# Patient Record
Sex: Female | Born: 1985 | Race: Black or African American | Hispanic: No | Marital: Single | Smoking: Never smoker
Health system: Southern US, Community
[De-identification: ages and names within clinical notes are randomized; demographics above are authoritative.]

## PROBLEM LIST (undated history)

## (undated) ENCOUNTER — Inpatient Hospital Stay (HOSPITAL_COMMUNITY): Payer: Self-pay

## (undated) DIAGNOSIS — O24419 Gestational diabetes mellitus in pregnancy, unspecified control: Secondary | ICD-10-CM

## (undated) DIAGNOSIS — R87619 Unspecified abnormal cytological findings in specimens from cervix uteri: Secondary | ICD-10-CM

## (undated) DIAGNOSIS — Z9889 Other specified postprocedural states: Secondary | ICD-10-CM

## (undated) DIAGNOSIS — Z789 Other specified health status: Secondary | ICD-10-CM

## (undated) HISTORY — PX: HERNIA REPAIR: SHX51

## (undated) HISTORY — DX: Gestational diabetes mellitus in pregnancy, unspecified control: O24.419

## (undated) HISTORY — PX: WISDOM TOOTH EXTRACTION: SHX21

## (undated) HISTORY — PX: COLPOSCOPY: SHX161

---
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
## 2007-10-01 ENCOUNTER — Other Ambulatory Visit: Admission: RE | Admit: 2007-10-01 | Discharge: 2007-10-01 | Payer: Self-pay | Admitting: Gynecology

## 2008-10-02 ENCOUNTER — Other Ambulatory Visit: Admission: RE | Admit: 2008-10-02 | Discharge: 2008-10-02 | Payer: Self-pay | Admitting: Gynecology

## 2008-10-02 ENCOUNTER — Encounter: Payer: Self-pay | Admitting: Gynecology

## 2008-10-02 ENCOUNTER — Ambulatory Visit: Payer: Self-pay | Admitting: Gynecology

## 2008-10-21 ENCOUNTER — Ambulatory Visit: Payer: Self-pay | Admitting: Gynecology

## 2009-10-04 ENCOUNTER — Ambulatory Visit: Payer: Self-pay | Admitting: Gynecology

## 2009-10-04 ENCOUNTER — Other Ambulatory Visit: Admission: RE | Admit: 2009-10-04 | Discharge: 2009-10-04 | Payer: Self-pay | Admitting: Gynecology

## 2009-11-04 ENCOUNTER — Ambulatory Visit: Payer: Self-pay | Admitting: Gynecology

## 2010-10-11 ENCOUNTER — Other Ambulatory Visit (HOSPITAL_COMMUNITY)
Admission: RE | Admit: 2010-10-11 | Discharge: 2010-10-11 | Disposition: A | Discharge status: Home / Self Care | Payer: 59 | Source: Ambulatory Visit | Attending: Gynecology | Admitting: Gynecology

## 2010-10-11 ENCOUNTER — Encounter: Payer: 59 | Admitting: Gynecology

## 2010-10-11 ENCOUNTER — Other Ambulatory Visit: Payer: Self-pay | Admitting: Gynecology

## 2010-10-11 DIAGNOSIS — Z113 Encounter for screening for infections with a predominantly sexual mode of transmission: Secondary | ICD-10-CM

## 2010-10-11 DIAGNOSIS — Z01419 Encounter for gynecological examination (general) (routine) without abnormal findings: Secondary | ICD-10-CM

## 2010-10-11 DIAGNOSIS — Z833 Family history of diabetes mellitus: Secondary | ICD-10-CM

## 2010-10-11 DIAGNOSIS — R82998 Other abnormal findings in urine: Secondary | ICD-10-CM

## 2010-10-11 DIAGNOSIS — Z124 Encounter for screening for malignant neoplasm of cervix: Secondary | ICD-10-CM | POA: Insufficient documentation

## 2011-01-20 NOTE — Op Note (Signed)
[facility]  Patient:    Toni Morgan, Toni Morgan                        MRN: [id number] Proc. Date: 12/21/99 Adm. Date:  [id number] Attending:  Fayette Pho Damodar CC:         Merita Norton, M.D.                           Operative Report  PREOPERATIVE DIAGNOSIS:  Umbilical hernia.  POSTOPERATIVE DIAGNOSIS:  Umbilical hernia.  OPERATION PERFORMED:  Repair of umbilical hernia.  SURGEON:  Prabhakar D. Levie Heritage, M.D.  ASSISTANT:  Nurse.  ANESTHESIA:  Nurse.  DESCRIPTION OF PROCEDURE:  Under satisfactory general anesthesia with the patient in the supine position, the abdomen was thoroughly prepped and draped in the usual manner.  A curvilinear infraumbilical incision was made.  The skin and subcutaneous tissue was incised.  Bleeders were individually clamped, cut and electrocoagulated. Blunt and sharp dissection was carried out to isolate the umbilical hernia sac.  The neck of the sac was opened.  Bleeders were clamped, cut and electrocoagulated. The umbilical fascia defect was repaired in two layers, the first layer of #32 ire vertical mattress sutures, the second layer of 3-0 Vicryl interrupted suture. Satisfactory repair was accomplished.  Excess umbilical hernia sac was excised nd hemostasis accomplished.  Then, 0.25% Marcaine with epinephrine was injected locally for postoperative analgesia.  The subcutaneous tissue was approximated ith 4-0 Vicryl.  The skin was closed with 4-0 Monocryl subcuticular sutures.  There was minimal gaping of the incision.  Hence, 5-0 chromic interrupted sutures were used. Appropriate pressure dressing was applied.  Throughout the procedure, the patients vital signs remained stable.  The patient withstood the procedure well and was transferred to the recovery room in satisfactory general condition. DD:  12/21/99 TD:  12/21/99 Job: [id number]

## 2011-07-13 ENCOUNTER — Emergency Department (HOSPITAL_BASED_OUTPATIENT_CLINIC_OR_DEPARTMENT_OTHER)
Admission: EM | Admit: 2011-07-13 | Discharge: 2011-07-13 | Disposition: A | Discharge status: Home / Self Care | Payer: Self-pay | Attending: Emergency Medicine | Admitting: Emergency Medicine

## 2011-07-13 ENCOUNTER — Encounter: Payer: Self-pay | Admitting: Family Medicine

## 2011-07-13 DIAGNOSIS — L600 Ingrowing nail: Secondary | ICD-10-CM | POA: Insufficient documentation

## 2011-07-13 DIAGNOSIS — M79609 Pain in unspecified limb: Secondary | ICD-10-CM | POA: Insufficient documentation

## 2011-07-13 NOTE — ED Notes (Signed)
Pt sts "I think I have an ingrown toe nail on my left great toe". Pt reports soaking toe at home.

## 2011-07-13 NOTE — ED Provider Notes (Signed)
History     CSN: [id number] Arrival date & time: 07/13/2011  5:21 PM   First MD Initiated Contact with Patient 07/13/11 1758      Chief Complaint  Patient presents with  . Toe Pain    (Consider location/radiation/quality/duration/timing/severity/associated sxs/prior treatment) HPI The patient presents with 1 week of pain in her left great toe. She notes that prior to the onset of this pain she was in her usual state of health. Since onset she has had persistent pain focally about the left great toe on the lateral aspect of the nailbed. The pain has been worse with ambulation, better as she has manipulated the toe and had soakings. She notes that she is been able to decrease the pain as she has produced more fluid and pus and blood from the edge of the nailbed. She also does size has decreased, but presents for reassurance and further guidance. She denies any fevers, chills, nausea, vomiting, diarrhea or any other focal complaints. History reviewed. No pertinent past medical history.  Past Surgical History  Procedure Date  . Hernia repair     No family history on file.  History  Substance Use Topics  . Smoking status: Never Smoker   . Smokeless tobacco: Not on file  . Alcohol Use: No    OB History    Grav Para Term Preterm Abortions TAB SAB Ect Mult Living                  Review of Systems  All other systems reviewed and are negative.    Allergies  Review of patient's allergies indicates no known allergies.  Home Medications  No current outpatient prescriptions on file.  BP 123/75  Pulse 96  Temp(Src) 98.1 F (36.7 C) (Oral)  Resp 16  Ht 5\' 8"  (1.727 m)  Wt 185 lb (83.915 kg)  BMI 28.13 kg/m2  SpO2 100%  LMP 06/15/2011  Physical Exam  Constitutional: She appears well-developed and well-nourished. No distress.  HENT:  Head: Normocephalic and atraumatic.  Eyes: Conjunctivae and EOM are normal.  Cardiovascular: Intact distal pulses.   Musculoskeletal:         The left great is within normal limits. The left great toe on the lateral edge of the nailbed has evidence of an ingrown toenail, with no erythema no mild pressure there is a small amount of serous and pus like fluid extruding from the edge of the nailbed. All neurovascular functions are intact.  Neurological: She is alert. No cranial nerve deficit.  Skin: Skin is warm and dry.  Psychiatric: She has a normal mood and affect.    ED Course  Procedures (including critical care time)  Labs Reviewed - No data to display No results found.   No diagnosis found.    MDM  This 25 year old female presents with left great toe pain. On exam she is in no distress, has findings consistent with an ingrown toe and local infection. The absence of any erythema, fevers, chills, remarkable pain is all reassuring for a local process. The pros and cons of antibiotics were discussed with the patient. She'll not be placed on antibiotics. She was given explicit care instructions, and return precautions.       Gerhard Munch, MD 07/13/11 [phone]

## 2012-07-23 ENCOUNTER — Encounter (HOSPITAL_COMMUNITY): Payer: Self-pay | Admitting: *Deleted

## 2012-07-23 ENCOUNTER — Emergency Department (INDEPENDENT_AMBULATORY_CARE_PROVIDER_SITE_OTHER)
Admission: EM | Admit: 2012-07-23 | Discharge: 2012-07-23 | Disposition: A | Discharge status: Home / Self Care | Payer: Self-pay | Source: Home / Self Care | Attending: Family Medicine | Admitting: Family Medicine

## 2012-07-23 DIAGNOSIS — J029 Acute pharyngitis, unspecified: Secondary | ICD-10-CM

## 2012-07-23 LAB — POCT RAPID STREP A: Streptococcus, Group A Screen (Direct): NEGATIVE

## 2012-07-23 MED ORDER — AMOXICILLIN-POT CLAVULANATE 875-125 MG PO TABS: 1.0000 | ORAL_TABLET | Freq: Two times a day (BID) | ORAL | Status: DC

## 2012-07-23 NOTE — ED Notes (Signed)
Pt    Has  Symptoms    Of      sorethroat  Redness   And pain   When  She  Swallows        She has      Redness   And    Exudate  In tonsils       Symptoms  X     2  Days    She  Is  Sitting  Upright opn  Exam table  Speaking in  Complete  sentances

## 2012-07-23 NOTE — ED Provider Notes (Signed)
History     CSN: [id number]  Arrival date & time 07/23/12  4098   First MD Initiated Contact with Patient 07/23/12 [phone]      Chief Complaint  Patient presents with  . Sore Throat    (Consider location/radiation/quality/duration/timing/severity/associated sxs/prior treatment) Patient is a 26 y.o. female presenting with pharyngitis. The history is provided by the patient.  Sore Throat This is a recurrent (3 episode since aug, no medical rx however, just uses lozenges.) problem. The current episode started 2 days ago. The problem has been gradually worsening. Associated symptoms comments: No sick contacts.. The symptoms are aggravated by swallowing.    History reviewed. No pertinent past medical history.  Past Surgical History  Procedure Date  . Hernia repair     No family history on file.  History  Substance Use Topics  . Smoking status: Never Smoker   . Smokeless tobacco: Not on file  . Alcohol Use: No    OB History    Grav Para Term Preterm Abortions TAB SAB Ect Mult Living                  Review of Systems  Constitutional: Positive for fever, chills and appetite change.  HENT: Positive for sore throat and neck pain. Negative for neck stiffness.   Skin: Negative for rash.    Allergies  Review of patient's allergies indicates no known allergies.  Home Medications   Current Outpatient Rx  Name  Route  Sig  Dispense  Refill  . AMOXICILLIN-POT CLAVULANATE 875-125 MG PO TABS   Oral   Take 1 tablet by mouth 2 (two) times daily.   20 tablet   0     BP 137/82  Pulse 100  Temp 98.8 F (37.1 C) (Oral)  Resp 20  SpO2 96%  LMP 06/30/2012  Physical Exam  Nursing note and vitals reviewed. Constitutional: She appears well-developed and well-nourished.  HENT:  Head: Normocephalic.  Right Ear: External ear normal.  Left Ear: External ear normal.  Mouth/Throat: Uvula is midline and mucous membranes are normal. Oropharyngeal exudate and posterior  oropharyngeal erythema present.  Eyes: Conjunctivae normal are normal. Pupils are equal, round, and reactive to light.  Neck: Normal range of motion. Neck supple.  Cardiovascular: Normal rate, normal heart sounds and intact distal pulses.   Pulmonary/Chest: Effort normal and breath sounds normal.  Lymphadenopathy:    She has cervical adenopathy.  Skin: Skin is warm and dry.    ED Course  Procedures (including critical care time)   Labs Reviewed  POCT RAPID STREP A ([facility]RG CARE ONLY)   No results found.   1. Pharyngitis, acute       MDM          Linna Hoff, MD 07/23/12 [phone]

## 2012-11-12 ENCOUNTER — Inpatient Hospital Stay (HOSPITAL_COMMUNITY)
Admission: AD | Admit: 2012-11-12 | Discharge: 2012-11-12 | Disposition: A | Discharge status: Home / Self Care | Payer: Self-pay | Source: Ambulatory Visit | Attending: Obstetrics & Gynecology | Admitting: Obstetrics & Gynecology

## 2012-11-12 ENCOUNTER — Inpatient Hospital Stay (HOSPITAL_COMMUNITY): Payer: Self-pay

## 2012-11-12 ENCOUNTER — Encounter (HOSPITAL_COMMUNITY): Payer: Self-pay | Admitting: *Deleted

## 2012-11-12 DIAGNOSIS — O209 Hemorrhage in early pregnancy, unspecified: Secondary | ICD-10-CM | POA: Insufficient documentation

## 2012-11-12 DIAGNOSIS — Z3490 Encounter for supervision of normal pregnancy, unspecified, unspecified trimester: Secondary | ICD-10-CM

## 2012-11-12 DIAGNOSIS — Z1389 Encounter for screening for other disorder: Secondary | ICD-10-CM

## 2012-11-12 HISTORY — DX: Other specified health status: Z78.9

## 2012-11-12 LAB — CBC
HCT: 37.4 % (ref 36.0–46.0)
Hemoglobin: 12.5 g/dL (ref 12.0–15.0)
MCH: 27.5 pg (ref 26.0–34.0)
MCHC: 33.4 g/dL (ref 30.0–36.0)
MCV: 82.2 fL (ref 78.0–100.0)
Platelets: 242 10*3/uL (ref 150–400)
RBC: 4.55 MIL/uL (ref 3.87–5.11)
RDW: 13.4 % (ref 11.5–15.5)
WBC: 12.4 10*3/uL — ABNORMAL HIGH (ref 4.0–10.5)

## 2012-11-12 LAB — WET PREP, GENITAL
Clue Cells Wet Prep HPF POC: NONE SEEN
Trich, Wet Prep: NONE SEEN
Yeast Wet Prep HPF POC: NONE SEEN

## 2012-11-12 LAB — HCG, QUANTITATIVE, PREGNANCY: hCG, Beta Chain, Quant, S: 28677 m[IU]/mL — ABNORMAL HIGH (ref <5)

## 2012-11-12 LAB — POCT PREGNANCY, URINE: Preg Test, Ur: POSITIVE — AB

## 2012-11-12 LAB — ABO/RH: ABO/RH(D): B POS

## 2012-11-12 NOTE — MAU Note (Signed)
I started bleeding this morning about 0950. No pain. When i got urine sample here it was real like on toilet paper when wiped.

## 2012-11-12 NOTE — MAU Note (Signed)
[facility]  Name and DOB verified, pt confirms spelling is correct on IDband.

## 2012-11-12 NOTE — MAU Provider Note (Signed)
History    CSN: [id number]  Arrival date and time: 11/12/12 1040   First Provider Initiated Contact with Patient 11/12/12 1209      Chief Complaint  Patient presents with  . Vaginal Bleeding   HPI  Pt is a 27 y/o G1P0 female GA [redacted]w[redacted]d (LMP 10/03/12) who presents today for abnormal vaginal bleeding during pregnancy. She reports that she saw blood on the toilet paper this morning after using the bathroom, as well as a small blood clot in the toilet. The blood was bright red and has since decreased. She feels well otherwise. She denies fever/chills, dysuria, recent illnesses. She reports some crampy abdominal pain as well as nausea with a threatened episode of emesis today, but did not actually vomit.     Past Medical History  Diagnosis Date  . Medical history non-contributory     Past Surgical History  Procedure Laterality Date  . Hernia repair      Family History  Problem Relation Age of Onset  . Diabetes Maternal Uncle   . Cancer Maternal Grandmother     History  Substance Use Topics  . Smoking status: Never Smoker   . Smokeless tobacco: Not on file  . Alcohol Use: Yes     Comment: occ before pos upt but none now    Allergies: No Known Allergies  Prescriptions prior to admission  Medication Sig Dispense Refill  . Prenatal Vit-Fe Fumarate-FA (PRENATAL MULTIVITAMIN) TABS Take 2 tablets by mouth daily at 12 noon.        ROS A 14-point review of systems was asked and positive pertinent and negatives are discussed in the HPI.  Physical Exam   Blood pressure 118/69, pulse 90, temperature 99.1 F (37.3 C), temperature source Oral, resp. rate 20, height 5\' 5"  (1.651 m), weight 86.637 kg (191 lb), last menstrual period 10/03/2012, unknown if currently breastfeeding.  Physical Exam  GI: There is no tenderness. There is no rigidity, no rebound and no guarding.  Genitourinary: There is no rash, tenderness, lesion or injury on the right labia. There is no rash, tenderness,  lesion or injury on the left labia. Cervix exhibits discharge (blood-tinged discharge). Cervix exhibits no friability. Right adnexum displays no mass and no tenderness. Left adnexum displays no mass and no tenderness. No erythema or bleeding around the vagina. No foreign body around the vagina. No signs of injury around the vagina.   Results for orders placed during the hospital encounter of 11/12/12 (from the past 24 hour(s))  POCT PREGNANCY, URINE     Status: Abnormal   Collection Time    11/12/12 11:22 AM      Result Value Range   Preg Test, Ur POSITIVE (*) NEGATIVE    MAU Course  Procedures  Results for orders placed during the hospital encounter of 11/12/12 (from the past 168 hour(s))  POCT PREGNANCY, URINE   Collection Time    11/12/12 11:22 AM      Result Value Range   Preg Test, Ur POSITIVE (*) NEGATIVE  CBC   Collection Time    11/12/12 12:10 PM      Result Value Range   WBC 12.4 (*) 4.0 - 10.5 K/uL   RBC 4.55  3.87 - 5.11 MIL/uL   Hemoglobin 12.5  12.0 - 15.0 g/dL   HCT 96.2  95.2 - 84.1 %   MCV 82.2  78.0 - 100.0 fL   MCH 27.5  26.0 - 34.0 pg   MCHC 33.4  30.0 - 36.0 g/dL  RDW 13.4  11.5 - 15.5 %   Platelets 242  150 - 400 K/uL  HCG, QUANTITATIVE, PREGNANCY   Collection Time    11/12/12 12:10 PM      Result Value Range   hCG, Beta Francene Finders [address] (*) <5 mIU/mL  ABO/RH   Collection Time    11/12/12 12:10 PM      Result Value Range   ABO/RH(D) B POS    WET PREP, GENITAL   Collection Time    11/12/12  1:15 PM      Result Value Range   Yeast Wet Prep HPF POC NONE SEEN  NONE SEEN   Trich, Wet Prep NONE SEEN  NONE SEEN   Clue Cells Wet Prep HPF POC NONE SEEN  NONE SEEN   WBC, Wet Prep HPF POC TOO NUMEROUS TO COUNT (*) NONE SEEN  GC/CHLAMYDIA PROBE AMP   Collection Time    11/12/12  1:15 PM      Result Value Range   CT Probe RNA NEGATIVE  NEGATIVE   GC Probe RNA NEGATIVE  NEGATIVE     Assessment and Plan  1. Abnormal bleeding during pregnancy,  1st trimester - CBC normal, serum quant HCG normal - ABO/Rh - B pos - U/S to assess for IUP - normal - Pelvic exam with wet prep, normal - Recommend pelvic rest - Note to employer recommending lite duty - Return to MAU if bleeding increases significantly   Lorna Dibble, PA-S 11/12/2012, 12:29 PM

## 2012-11-13 LAB — GC/CHLAMYDIA PROBE AMP
CT Probe RNA: NEGATIVE
GC Probe RNA: NEGATIVE

## 2012-11-14 NOTE — MAU Provider Note (Signed)
Attestation of Attending Supervision of Advanced Practitioner (PA/CNM/NP): Evaluation and management procedures were performed by the Advanced Practitioner under my supervision and collaboration.  I have reviewed the Advanced Practitioner's note and chart, and I agree with the management and plan.  Alyssamarie Mounsey, MD, FACOG Attending Obstetrician & Gynecologist Faculty Practice, [facility]  

## 2012-11-17 ENCOUNTER — Inpatient Hospital Stay (HOSPITAL_COMMUNITY)
Admission: AD | Admit: 2012-11-17 | Discharge: 2012-11-17 | Disposition: A | Discharge status: Home / Self Care | Payer: Self-pay | Source: Ambulatory Visit | Attending: Obstetrics & Gynecology | Admitting: Obstetrics & Gynecology

## 2012-11-17 ENCOUNTER — Encounter (HOSPITAL_COMMUNITY): Payer: Self-pay | Admitting: *Deleted

## 2012-11-17 ENCOUNTER — Inpatient Hospital Stay (HOSPITAL_COMMUNITY): Payer: Self-pay

## 2012-11-17 DIAGNOSIS — O4690 Antepartum hemorrhage, unspecified, unspecified trimester: Secondary | ICD-10-CM

## 2012-11-17 DIAGNOSIS — O209 Hemorrhage in early pregnancy, unspecified: Secondary | ICD-10-CM | POA: Insufficient documentation

## 2012-11-17 LAB — URINALYSIS, ROUTINE W REFLEX MICROSCOPIC
Bilirubin Urine: NEGATIVE
Glucose, UA: NEGATIVE mg/dL
Ketones, ur: NEGATIVE mg/dL
Leukocytes, UA: NEGATIVE
Nitrite: NEGATIVE
Protein, ur: NEGATIVE mg/dL
Specific Gravity, Urine: >1.03 — ABNORMAL HIGH (ref 1.005–1.030)
Urobilinogen, UA: 0.2 mg/dL (ref 0.0–1.0)
pH: 6 (ref 5.0–8.0)

## 2012-11-17 LAB — URINE MICROSCOPIC-ADD ON

## 2012-11-17 NOTE — MAU Provider Note (Signed)
History     CSN: [id number]  Arrival date and time: 11/17/12 1505   First Provider Initiated Contact with Patient 11/17/12 1549      Chief Complaint  Patient presents with  . Vaginal Bleeding   HPI  Toni Morgan  is a 27 y.o. G1P0 at 102w3d weeks presenting with vaginal bleeding like a light period starting today, no pain. Was seen in MAU on 3/11 with lighter bleeding, 5.5 week IUP on u/s with + FHR at that time, wet prep and GC negative. Bleeding stopped after a couple of days and did not restart until today. No recent intercourse.    Past Medical History  Diagnosis Date  . Medical history non-contributory     Past Surgical History  Procedure Laterality Date  . Hernia repair      Family History  Problem Relation Age of Onset  . Diabetes Maternal Uncle   . Cancer Maternal Grandmother     History  Substance Use Topics  . Smoking status: Never Smoker   . Smokeless tobacco: Not on file  . Alcohol Use: Yes     Comment: occ before pos upt but none now    Allergies: No Known Allergies  Prescriptions prior to admission  Medication Sig Dispense Refill  . Prenatal Vit-Fe Fumarate-FA (PRENATAL MULTIVITAMIN) TABS Take 2 tablets by mouth daily at 12 noon.        Review of Systems  Constitutional: Negative.   Respiratory: Negative.   Cardiovascular: Negative.   Gastrointestinal: Positive for nausea. Negative for vomiting, abdominal pain, diarrhea and constipation.  Genitourinary: Negative for dysuria, urgency, frequency, hematuria and flank pain.       + vaginal bleeding   Musculoskeletal: Negative.   Neurological: Negative.   Psychiatric/Behavioral: Negative.    Physical Exam   Blood pressure 126/68, pulse 91, temperature 98.6 F (37 C), resp. rate 18, height 5\' 5"  (1.651 m), weight 192 lb 6.4 oz (87.272 kg), last menstrual period 10/03/2012.  Physical Exam  Nursing note and vitals reviewed. Constitutional: She is oriented to person, place, and time. She  appears well-developed and well-nourished. No distress.  Cardiovascular: Normal rate.   Respiratory: Effort normal.  GI: Soft. There is no tenderness.  Musculoskeletal: Normal range of motion.  Neurological: She is alert and oriented to person, place, and time.  Skin: Skin is warm and dry.  Psychiatric: She has a normal mood and affect.    MAU Course  Procedures US Ob Comp Less 14 Wks  11/12/2012  OBSTETRICAL ULTRASOUND: This exam was performed within a [facility]. The OB US report was generated in the AS system, and faxed to the ordering physician.   This report is also available in [employer] and in the [employer]. See AS Obstetric US report.   US Ob Transvaginal  11/17/2012  *RADIOLOGY REPORT*  Clinical Data: 27 year old pregnant female with pelvic pain and bleeding.  Estimated gestation of 6 weeks 3 days by LMP.  TRANSVAGINAL OBSTETRIC US  Technique:  Transvaginal ultrasound was performed for complete evaluation of the gestation as well as the maternal uterus, adnexal regions, and pelvic cul-de-sac.  Comparison:  None  Intrauterine gestational sac: Visualized and normal appearing. Yolk sac: Present Embryo: Present Cardiac Activity: Present Heart Rate: 121  CRL: 6.1 mm           6   w  3   d        [place] EDC: 07/10/2013  Subchorionic hemorrhage: Small - moderate  Maternal uterus/adnexae: The ovaries bilaterally are unremarkable with a right corpus luteum.  A 7 mm left paraovarian cyst is present. There is no evidence of solid adnexal mass or free fluid.  IMPRESSION: Single living intrauterine gestation with estimated gestational age of [redacted] weeks 3 days by this ultrasound.  Small - moderate subchorionic hemorrhage.   Original Report Authenticated By: Harmon Pier, M.D.    US Ob Transvaginal  11/12/2012  OBSTETRICAL ULTRASOUND: This exam was performed within a [facility]. The OB US report was generated in the [facility] system, and faxed to  the ordering physician.   This report is also available in [employer] and in the [employer]. See [facility] Obstetric US report.    Assessment and Plan   1. Vaginal bleeding in pregnancy, first trimester   Subchorionic hemorrhage on u/s - rev'd precautions, pelvic rest, start prenatal care - awaiting medicaid, planning on care with [facility]    Medication List    TAKE these medications       prenatal multivitamin Tabs  Take 2 tablets by mouth daily at 12 noon.            Follow-up Information   Follow up with provider of your choice. (start prenatal care as soon as possible)         Yareth Macdonnell 11/17/2012, 3:54 PM

## 2012-11-17 NOTE — MAU Note (Signed)
Pt reports she started having vaginal bleeding today. Flowing like a period denies pain or discomfort at this time.

## 2012-11-17 NOTE — MAU Provider Note (Signed)
Attestation of Attending Supervision of Advanced Practitioner (CNM/NP): Evaluation and management procedures were performed by the Advanced Practitioner under my supervision and collaboration.  I have reviewed the Advanced Practitioner's note and chart, and I agree with the management and plan.  HARRAWAY-SMITH, Karisa Nesser 7:30 PM

## 2013-02-28 LAB — OB RESULTS CONSOLE HEPATITIS B SURFACE ANTIGEN: Hepatitis B Surface Ag: NEGATIVE

## 2013-02-28 LAB — OB RESULTS CONSOLE RUBELLA ANTIBODY, IGM: Rubella: IMMUNE

## 2013-02-28 LAB — OB RESULTS CONSOLE ANTIBODY SCREEN: Antibody Screen: NEGATIVE

## 2013-02-28 LAB — OB RESULTS CONSOLE ABO/RH: RH Type: POSITIVE

## 2013-02-28 LAB — OB RESULTS CONSOLE GC/CHLAMYDIA
Chlamydia: NEGATIVE
Gonorrhea: NEGATIVE

## 2013-02-28 LAB — OB RESULTS CONSOLE RPR: RPR: NONREACTIVE

## 2013-02-28 LAB — OB RESULTS CONSOLE HIV ANTIBODY (ROUTINE TESTING): HIV: NONREACTIVE

## 2013-03-19 ENCOUNTER — Inpatient Hospital Stay (HOSPITAL_COMMUNITY)
Admission: AD | Admit: 2013-03-19 | Discharge: 2013-03-19 | Disposition: A | Discharge status: Home / Self Care | Payer: Medicaid Other | Source: Ambulatory Visit | Attending: Obstetrics and Gynecology | Admitting: Obstetrics and Gynecology

## 2013-03-19 ENCOUNTER — Encounter (HOSPITAL_COMMUNITY): Payer: Self-pay | Admitting: *Deleted

## 2013-03-19 DIAGNOSIS — R7309 Other abnormal glucose: Secondary | ICD-10-CM | POA: Insufficient documentation

## 2013-03-19 DIAGNOSIS — R739 Hyperglycemia, unspecified: Secondary | ICD-10-CM

## 2013-03-19 DIAGNOSIS — T450X5A Adverse effect of antiallergic and antiemetic drugs, initial encounter: Secondary | ICD-10-CM | POA: Insufficient documentation

## 2013-03-19 DIAGNOSIS — O99891 Other specified diseases and conditions complicating pregnancy: Secondary | ICD-10-CM | POA: Insufficient documentation

## 2013-03-19 DIAGNOSIS — Y92009 Unspecified place in unspecified non-institutional (private) residence as the place of occurrence of the external cause: Secondary | ICD-10-CM | POA: Insufficient documentation

## 2013-03-19 LAB — URINALYSIS, ROUTINE W REFLEX MICROSCOPIC
Bilirubin Urine: NEGATIVE
Glucose, UA: >1000 mg/dL — AB
Hgb urine dipstick: NEGATIVE
Ketones, ur: NEGATIVE mg/dL
Leukocytes, UA: NEGATIVE
Nitrite: NEGATIVE
Protein, ur: NEGATIVE mg/dL
Specific Gravity, Urine: >1.03 — ABNORMAL HIGH (ref 1.005–1.030)
Urobilinogen, UA: 0.2 mg/dL (ref 0.0–1.0)
pH: 6 (ref 5.0–8.0)

## 2013-03-19 LAB — COMPREHENSIVE METABOLIC PANEL
ALT: 24 U/L (ref 0–35)
AST: 22 U/L (ref 0–37)
Albumin: 2.7 g/dL — ABNORMAL LOW (ref 3.5–5.2)
Alkaline Phosphatase: 66 U/L (ref 39–117)
BUN: 7 mg/dL (ref 6–23)
CO2: 23 mEq/L (ref 19–32)
Calcium: 9.5 mg/dL (ref 8.4–10.5)
Chloride: 99 mEq/L (ref 96–112)
Creatinine, Ser: 0.72 mg/dL (ref 0.50–1.10)
GFR calc Af Amer: >90 mL/min (ref >90)
GFR calc non Af Amer: >90 mL/min (ref >90)
Glucose, Bld: 257 mg/dL — ABNORMAL HIGH (ref 70–99)
Potassium: 4 mEq/L (ref 3.5–5.1)
Sodium: 132 mEq/L — ABNORMAL LOW (ref 135–145)
Total Bilirubin: 0.1 mg/dL — ABNORMAL LOW (ref 0.3–1.2)
Total Protein: 6.2 g/dL (ref 6.0–8.3)

## 2013-03-19 LAB — URINE MICROSCOPIC-ADD ON

## 2013-03-19 LAB — CBC
HCT: 34 % — ABNORMAL LOW (ref 36.0–46.0)
Hemoglobin: 11.5 g/dL — ABNORMAL LOW (ref 12.0–15.0)
MCH: 28.2 pg (ref 26.0–34.0)
MCHC: 33.8 g/dL (ref 30.0–36.0)
MCV: 83.3 fL (ref 78.0–100.0)
Platelets: 192 10*3/uL (ref 150–400)
RBC: 4.08 MIL/uL (ref 3.87–5.11)
RDW: 13.5 % (ref 11.5–15.5)
WBC: 10.1 10*3/uL (ref 4.0–10.5)

## 2013-03-19 MED ORDER — DIPHENHYDRAMINE HCL 25 MG PO TABS: 25.0000 mg | ORAL_TABLET | Freq: Four times a day (QID) | ORAL | Status: DC

## 2013-03-19 MED ORDER — DIPHENHYDRAMINE HCL 25 MG PO CAPS: 25.0000 mg | ORAL_CAPSULE | Freq: Once | ORAL | Status: DC

## 2013-03-19 NOTE — MAU Note (Signed)
States she has been feeling weak and shaky for a couple of weeks. Usually in the mornings, but one day in the afternoon and had to go lay down. Worse yesterday.

## 2013-03-19 NOTE — MAU Provider Note (Signed)
History     CSN: [id number]  Arrival date and time: 03/19/13 0814   First Provider Initiated Contact with Patient 03/19/13 [phone]      Chief Complaint  Patient presents with  . weak, shakey hands    HPI [name] is a 27 y.o. G1P0 at [redacted]w[redacted]d who presents to MAU today with multiple complaints. The patient states that she has felt faint and weak in the morning x 2 weeks. She denies any difference with changes in positions or diet. She has also had numbness in her hands bilaterally x months that is worse over the last few days. She was told in the office that she may be developing carpal tunnel. She also complains that she has a decreased ability to taste. She states that her lips were swollen yesterday, but denies throat or tongue swelling or SOB. She states no known allergies. She has also noted increased watering of the left eye. She denies contractions, vaginal bleeding or fever. She was recently treated for BV and is now having an off-white thicker discharge without odor, itching or irritation x "a few days." She reports good fetal movement.   OB History   Grav Para Term Preterm Abortions TAB SAB Ect Mult Living   1         0      Past Medical History  Diagnosis Date  . Medical history non-contributory     Past Surgical History  Procedure Laterality Date  . Hernia repair    . Wisdom tooth extraction      Family History  Problem Relation Age of Onset  . Diabetes Maternal Uncle   . Cancer Maternal Grandmother     History  Substance Use Topics  . Smoking status: Never Smoker   . Smokeless tobacco: Never Used  . Alcohol Use: Yes     Comment: occ before pos upt but none now    Allergies: No Known Allergies  No prescriptions prior to admission    Review of Systems  Constitutional: Positive for malaise/fatigue. Negative for fever.  Gastrointestinal: Positive for abdominal pain. Negative for nausea, vomiting, diarrhea and constipation.  Genitourinary: Negative  for dysuria, urgency and frequency.       Neg - vaginal bleeding + vaginal discharge  Neurological: Positive for dizziness, tingling, sensory change and weakness. Negative for loss of consciousness.   Physical Exam   Blood pressure 130/80, pulse 88, temperature 99.2 F (37.3 C), temperature source Oral, resp. rate 16, height 5\' 8"  (1.727 m), weight 206 lb (93.441 kg), last menstrual period 10/03/2012.  Physical Exam  Constitutional: She is oriented to person, place, and time. She appears well-developed and well-nourished. No distress.  HENT:  Head: Normocephalic and atraumatic.  Mouth/Throat: Mucous membranes are not pale, not dry and not cyanotic. Normal dentition. No edematous or dental caries. No oropharyngeal exudate, posterior oropharyngeal edema or posterior oropharyngeal erythema.  Cardiovascular: Normal rate, regular rhythm and normal heart sounds.   Respiratory: Effort normal and breath sounds normal. No respiratory distress.  GI: Soft. Bowel sounds are normal. She exhibits no distension and no mass. There is no tenderness. There is no rebound and no guarding.  Neurological: She is alert and oriented to person, place, and time.  Skin: Skin is warm and dry. No erythema.  Psychiatric: She has a normal mood and affect.   Results for orders placed during the hospital encounter of 03/19/13 (from the past 24 hour(s))  URINALYSIS, ROUTINE W REFLEX MICROSCOPIC  Status: Abnormal   Collection Time    03/19/13  8:41 AM      Result Value Range   Color, Urine YELLOW  YELLOW   APPearance CLEAR  CLEAR   Specific Gravity, Urine >1.030 (*) 1.005 - 1.030   pH 6.0  5.0 - 8.0   Glucose, UA >1000 (*) NEGATIVE mg/dL   Hgb urine dipstick NEGATIVE  NEGATIVE   Bilirubin Urine NEGATIVE  NEGATIVE   Ketones, ur NEGATIVE  NEGATIVE mg/dL   Protein, ur NEGATIVE  NEGATIVE mg/dL   Urobilinogen, UA 0.2  0.0 - 1.0 mg/dL   Nitrite NEGATIVE  NEGATIVE   Leukocytes, UA NEGATIVE  NEGATIVE  URINE  MICROSCOPIC-ADD ON     Status: None   Collection Time    03/19/13  8:41 AM      Result Value Range   Squamous Epithelial / LPF RARE  RARE   WBC, UA 0-2  <3 WBC/hpf   RBC / HPF 0-2  <3 RBC/hpf  CBC     Status: Abnormal   Collection Time    03/19/13  9:43 AM      Result Value Range   WBC 10.1  4.0 - 10.5 K/uL   RBC 4.08  3.87 - 5.11 MIL/uL   Hemoglobin 11.5 (*) 12.0 - 15.0 g/dL   HCT 16.1 (*) 09.6 - 04.5 %   MCV 83.3  78.0 - 100.0 fL   MCH 28.2  26.0 - 34.0 pg   MCHC 33.8  30.0 - 36.0 g/dL   RDW 40.9  81.1 - 91.4 %   Platelets 192  150 - 400 K/uL  COMPREHENSIVE METABOLIC PANEL     Status: Abnormal   Collection Time    03/19/13  9:43 AM      Result Value Range   Sodium 132 (*) 135 - 145 mEq/L   Potassium 4.0  3.5 - 5.1 mEq/L   Chloride 99  96 - 112 mEq/L   CO2 23  19 - 32 mEq/L   Glucose, Bld 257 (*) 70 - 99 mg/dL   BUN 7  6 - 23 mg/dL   Creatinine, Ser 7.82  0.50 - 1.10 mg/dL   Calcium 9.5  8.4 - 95.6 mg/dL   Total Protein 6.2  6.0 - 8.3 g/dL   Albumin 2.7 (*) 3.5 - 5.2 g/dL   AST 22  0 - 37 U/L   ALT 24  0 - 35 U/L   Alkaline Phosphatase 66  39 - 117 U/L   Total Bilirubin 0.1 (*) 0.3 - 1.2 mg/dL   GFR calc non Af Amer >90  >90 mL/min   GFR calc Af Amer >90  >90 mL/min    Fetal monitoring: Baseline: 140 bpm, moderate variability, + accelerations, no decerations Contractions: None  MAU Course  Procedures None  MDM Discussed with Dr. Ambrose Mantle. CBC, CMP today Discussed results with Dr. Ambrose Mantle. Have patient follow-up with PCP or in his office this week for hyperglycemia. Rx for Benadryl for lip swelling. Patient states that Sherron Monday, MD at [facility] is her primary physician. No family medicine PCP  Assessment and Plan  A: Hyperglycemia Allergic reaction  P: Discharge home Rx for benadryl sent to patient's pharmacy Anaphylaxis warning signs reviwed Patient encouraged to follow-up in the office as scheduled tomorrow Patient may return to MAU as needed  or if her condition were to change or worsen  Freddi Starr, PA-C  03/19/2013, 11:21 AM

## 2013-04-16 ENCOUNTER — Encounter: Payer: Medicaid Other | Attending: Obstetrics and Gynecology | Admitting: *Deleted

## 2013-04-16 VITALS — Ht 68.0 in | Wt 209.3 lb

## 2013-04-16 DIAGNOSIS — O99810 Abnormal glucose complicating pregnancy: Secondary | ICD-10-CM

## 2013-04-16 DIAGNOSIS — Z713 Dietary counseling and surveillance: Secondary | ICD-10-CM | POA: Insufficient documentation

## 2013-04-17 ENCOUNTER — Encounter: Payer: Self-pay | Admitting: *Deleted

## 2013-04-22 NOTE — Patient Instructions (Signed)
Goals:  Check glucose levels per MD as instructed  Follow Gestational Diabetes Diet as instructed  Call for follow-up as needed    

## 2013-04-22 NOTE — Progress Notes (Signed)
  Patient was seen on 04/16/13 for Gestational Diabetes self-management class at the [facility]. The following learning objectives were met by the patient during this course:   States the definition of Gestational Diabetes  States why dietary management is important in controlling blood glucose  Describes the effects each nutrient has on blood glucose levels  Demonstrates ability to create a balanced meal plan  Demonstrates carbohydrate counting   States when to check blood glucose levels  Demonstrates proper blood glucose monitoring techniques  States the effect of stress and exercise on blood glucose levels  States the importance of limiting caffeine and abstaining from alcohol and smoking  Blood glucose monitor given: Accu Chek Nano BG Monitoring Kit  Lot # [id number] Exp: 06/03/14 Blood glucose reading: 118  Patient instructed to monitor glucose levels: FBS: 60 - <90 1 hour: <140 2 hour: <120  *Patient received handouts:  Nutrition Diabetes and Pregnancy  Carbohydrate Counting List  Patient will be seen for follow-up as needed.

## 2013-04-24 ENCOUNTER — Encounter (HOSPITAL_COMMUNITY): Payer: Self-pay | Admitting: *Deleted

## 2013-04-24 ENCOUNTER — Inpatient Hospital Stay (HOSPITAL_COMMUNITY)
Admission: AD | Admit: 2013-04-24 | Discharge: 2013-04-24 | Disposition: A | Discharge status: Home / Self Care | Payer: Medicaid Other | Source: Ambulatory Visit | Attending: Obstetrics and Gynecology | Admitting: Obstetrics and Gynecology

## 2013-04-24 DIAGNOSIS — O4700 False labor before 37 completed weeks of gestation, unspecified trimester: Secondary | ICD-10-CM | POA: Insufficient documentation

## 2013-04-24 DIAGNOSIS — O479 False labor, unspecified: Secondary | ICD-10-CM

## 2013-04-24 DIAGNOSIS — O26879 Cervical shortening, unspecified trimester: Secondary | ICD-10-CM | POA: Insufficient documentation

## 2013-04-24 DIAGNOSIS — O4703 False labor before 37 completed weeks of gestation, third trimester: Secondary | ICD-10-CM

## 2013-04-24 MED ORDER — BETAMETHASONE SOD PHOS & ACET 6 (3-3) MG/ML IJ SUSP
12.0000 mg | Freq: Once | INTRAMUSCULAR | Status: AC
  Administered 2013-04-24: 12 mg via INTRAMUSCULAR
  Filled 2013-04-24: qty 2

## 2013-04-24 NOTE — MAU Note (Signed)
Patient states she was in the office and sent to MAU for evaluation of short cervix and 2cm dilated. Patient denies feeling contractions.

## 2013-04-24 NOTE — MAU Provider Note (Signed)
  History     CSN: [id number]  Arrival date and time: 04/24/13 1010   None     Chief Complaint  Patient presents with  . Labor Eval   HPIptis G1P0 at [redacted]w[redacted]d  here for monitoring after seen in office and noted to have a shortened cervix and 2 cm Pt is not having contractions, leakage of fluid, spotting or bleeding, UTI symptoms, constipation or diarrhea RN note: Patient states she was in the office and sent to MAU for evaluation of short cervix and 2cm dilated. Patient denies feeling contractions.   Past Medical History  Diagnosis Date  . Medical history non-contributory   . Gestational diabetes     Past Surgical History  Procedure Laterality Date  . Hernia repair    . Wisdom tooth extraction      Family History  Problem Relation Age of Onset  . Diabetes Maternal Uncle   . Cancer Maternal Grandmother     History  Substance Use Topics  . Smoking status: Never Smoker   . Smokeless tobacco: Never Used  . Alcohol Use: Yes     Comment: occ before pos upt but none now    Allergies: No Known Allergies  Prescriptions prior to admission  Medication Sig Dispense Refill  . diphenhydrAMINE (BENADRYL) 25 MG tablet Take 1 tablet (25 mg total) by mouth every 6 (six) hours.  20 tablet  0  . Prenatal Vit-Fe Fumarate-FA (PRENATAL MULTIVITAMIN) TABS Take 2 tablets by mouth daily at 12 noon.        Review of Systems  Constitutional: Negative for fever and chills.  Gastrointestinal: Negative for nausea, vomiting, abdominal pain, diarrhea and constipation.  Genitourinary: Negative for dysuria.   Physical Exam   Blood pressure 103/67, pulse 117, temperature 97 F (36.1 C), resp. rate 18, height 5\' 7"  (1.702 m), weight 92.715 kg (204 lb 6.4 oz), last menstrual period 10/03/2012.  Physical Exam  Nursing note and vitals reviewed. Constitutional: She is oriented to person, place, and time. She appears well-developed and well-nourished. No distress.  HENT:  Head: Normocephalic.   Eyes: Pupils are equal, round, and reactive to light.  Neck: Normal range of motion.  Cardiovascular: Normal rate.   Respiratory: Effort normal.  GI: Soft.  FHR 140 bpm with 15x15 accelerations; no ctx noted  Genitourinary: Vagina normal.  Musculoskeletal: Normal range of motion.  Neurological: She is alert and oriented to person, place, and time.  Skin: Skin is warm and dry.  Psychiatric: She has a normal mood and affect.    MAU Course  Procedures No results found for this or any previous visit (from the past 24 hour(s)).  Pt monitored today without any ctx Beta Methasone given IM given x1 Assessment and Plan  D/c home F/u per Dr. Galvin Proffer 04/24/2013, 10:35 AM

## 2013-04-24 NOTE — Progress Notes (Signed)
Pt to return for second dose of betamethasone on 04/25/13.

## 2013-04-25 ENCOUNTER — Inpatient Hospital Stay (HOSPITAL_COMMUNITY)
Admission: AD | Admit: 2013-04-25 | Discharge: 2013-04-25 | DRG: 778 | Disposition: A | Discharge status: Home / Self Care | Payer: Medicaid Other | Source: Ambulatory Visit | Attending: Obstetrics and Gynecology | Admitting: Obstetrics and Gynecology

## 2013-04-25 DIAGNOSIS — O4700 False labor before 37 completed weeks of gestation, unspecified trimester: Principal | ICD-10-CM | POA: Diagnosis present

## 2013-04-25 MED ORDER — BETAMETHASONE SOD PHOS & ACET 6 (3-3) MG/ML IJ SUSP
12.0000 mg | Freq: Once | INTRAMUSCULAR | Status: AC
  Administered 2013-04-25: 12 mg via INTRAMUSCULAR
  Filled 2013-04-25: qty 2

## 2013-04-25 NOTE — MAU Note (Signed)
Here for injection only, denies pain or bleeding.  

## 2013-06-11 LAB — OB RESULTS CONSOLE GBS: GBS: POSITIVE

## 2013-07-02 ENCOUNTER — Encounter (HOSPITAL_COMMUNITY): Payer: Medicaid Other | Admitting: Anesthesiology

## 2013-07-02 ENCOUNTER — Inpatient Hospital Stay (HOSPITAL_COMMUNITY)
Admission: AD | Admit: 2013-07-02 | Discharge: 2013-07-05 | DRG: 766 | Disposition: A | Discharge status: Home / Self Care | Payer: Medicaid Other | Source: Ambulatory Visit | Attending: Obstetrics and Gynecology | Admitting: Obstetrics and Gynecology

## 2013-07-02 ENCOUNTER — Inpatient Hospital Stay (HOSPITAL_COMMUNITY): Payer: Medicaid Other | Admitting: Anesthesiology

## 2013-07-02 ENCOUNTER — Encounter (HOSPITAL_COMMUNITY)
Admission: AD | Disposition: A | Discharge status: Home / Self Care | Payer: Self-pay | Source: Ambulatory Visit | Attending: Obstetrics and Gynecology

## 2013-07-02 ENCOUNTER — Encounter (HOSPITAL_COMMUNITY): Payer: Self-pay | Admitting: *Deleted

## 2013-07-02 DIAGNOSIS — Z98891 History of uterine scar from previous surgery: Secondary | ICD-10-CM

## 2013-07-02 DIAGNOSIS — Z22330 Carrier of Group B streptococcus: Secondary | ICD-10-CM

## 2013-07-02 DIAGNOSIS — O99892 Other specified diseases and conditions complicating childbirth: Secondary | ICD-10-CM | POA: Diagnosis present

## 2013-07-02 DIAGNOSIS — O99814 Abnormal glucose complicating childbirth: Secondary | ICD-10-CM | POA: Diagnosis present

## 2013-07-02 DIAGNOSIS — O321XX0 Maternal care for breech presentation, not applicable or unspecified: Principal | ICD-10-CM | POA: Diagnosis present

## 2013-07-02 LAB — CBC
HCT: 33.4 % — ABNORMAL LOW (ref 36.0–46.0)
HCT: 30 % — ABNORMAL LOW (ref 36.0–46.0)
Hemoglobin: 11.2 g/dL — ABNORMAL LOW (ref 12.0–15.0)
Hemoglobin: 10.1 g/dL — ABNORMAL LOW (ref 12.0–15.0)
MCH: 25.2 pg — ABNORMAL LOW (ref 26.0–34.0)
MCH: 25.8 pg — ABNORMAL LOW (ref 26.0–34.0)
MCHC: 33.5 g/dL (ref 30.0–36.0)
MCHC: 33.7 g/dL (ref 30.0–36.0)
MCV: 75.1 fL — ABNORMAL LOW (ref 78.0–100.0)
MCV: 76.5 fL — ABNORMAL LOW (ref 78.0–100.0)
Platelets: 226 10*3/uL (ref 150–400)
Platelets: 207 10*3/uL (ref 150–400)
RBC: 4.45 MIL/uL (ref 3.87–5.11)
RBC: 3.92 MIL/uL (ref 3.87–5.11)
RDW: 14.1 % (ref 11.5–15.5)
RDW: 14.3 % (ref 11.5–15.5)
WBC: 11.6 10*3/uL — ABNORMAL HIGH (ref 4.0–10.5)
WBC: 18.3 10*3/uL — ABNORMAL HIGH (ref 4.0–10.5)

## 2013-07-02 LAB — COMPREHENSIVE METABOLIC PANEL
ALT: 17 U/L (ref 0–35)
AST: 24 U/L (ref 0–37)
Albumin: 2.6 g/dL — ABNORMAL LOW (ref 3.5–5.2)
Alkaline Phosphatase: 211 U/L — ABNORMAL HIGH (ref 39–117)
BUN: 10 mg/dL (ref 6–23)
CO2: 22 mEq/L (ref 19–32)
Calcium: 9.5 mg/dL (ref 8.4–10.5)
Chloride: 105 mEq/L (ref 96–112)
Creatinine, Ser: 0.81 mg/dL (ref 0.50–1.10)
GFR calc Af Amer: >90 mL/min (ref >90)
GFR calc non Af Amer: >90 mL/min (ref >90)
Glucose, Bld: 121 mg/dL — ABNORMAL HIGH (ref 70–99)
Potassium: 4 mEq/L (ref 3.5–5.1)
Sodium: 136 mEq/L (ref 135–145)
Total Bilirubin: 0.3 mg/dL (ref 0.3–1.2)
Total Protein: 6.1 g/dL (ref 6.0–8.3)

## 2013-07-02 LAB — RPR: RPR Ser Ql: NONREACTIVE

## 2013-07-02 LAB — GLUCOSE, CAPILLARY
Glucose-Capillary: 98 mg/dL (ref 70–99)
Glucose-Capillary: 80 mg/dL (ref 70–99)
Glucose-Capillary: 96 mg/dL (ref 70–99)

## 2013-07-02 LAB — POCT FERN TEST: POCT Fern Test: POSITIVE

## 2013-07-02 SURGERY — Surgical Case
Anesthesia: Epidural | Site: Abdomen | Wound class: Clean Contaminated

## 2013-07-02 MED ORDER — PHENYLEPHRINE 40 MCG/ML (10ML) SYRINGE FOR IV PUSH (FOR BLOOD PRESSURE SUPPORT): 80.0000 ug | PREFILLED_SYRINGE | INTRAVENOUS | Status: DC | PRN

## 2013-07-02 MED ORDER — CEFAZOLIN SODIUM-DEXTROSE 2-3 GM-% IV SOLR
INTRAVENOUS | Status: DC | PRN
  Administered 2013-07-02: 2 g via INTRAVENOUS

## 2013-07-02 MED ORDER — SCOPOLAMINE 1 MG/3DAYS TD PT72
1.0000 | MEDICATED_PATCH | Freq: Once | TRANSDERMAL | Status: AC
  Administered 2013-07-02: 1.5 mg via TRANSDERMAL

## 2013-07-02 MED ORDER — LACTATED RINGERS IV SOLN
INTRAVENOUS | Status: DC
  Administered 2013-07-02: 20:00:00 via INTRAVENOUS

## 2013-07-02 MED ORDER — MEPERIDINE HCL 25 MG/ML IJ SOLN
INTRAMUSCULAR | Status: AC
  Filled 2013-07-02: qty 1

## 2013-07-02 MED ORDER — TERBUTALINE SULFATE 1 MG/ML IJ SOLN: 0.2500 mg | Freq: Once | INTRAMUSCULAR | Status: DC | PRN

## 2013-07-02 MED ORDER — MIDAZOLAM HCL 2 MG/2ML IJ SOLN: 0.5000 mg | Freq: Once | INTRAMUSCULAR | Status: DC | PRN

## 2013-07-02 MED ORDER — ONDANSETRON HCL 4 MG/2ML IJ SOLN
4.0000 mg | Freq: Three times a day (TID) | INTRAMUSCULAR | Status: DC | PRN
  Filled 2013-07-02: qty 2

## 2013-07-02 MED ORDER — FENTANYL CITRATE 0.05 MG/ML IJ SOLN: 25.0000 ug | INTRAMUSCULAR | Status: DC | PRN

## 2013-07-02 MED ORDER — LIDOCAINE HCL (PF) 1 % IJ SOLN
INTRAMUSCULAR | Status: DC | PRN
  Administered 2013-07-02 (×2): 5 mL

## 2013-07-02 MED ORDER — EPHEDRINE 5 MG/ML INJ: 10.0000 mg | INTRAVENOUS | Status: DC | PRN

## 2013-07-02 MED ORDER — DIPHENHYDRAMINE HCL 25 MG PO CAPS
25.0000 mg | ORAL_CAPSULE | Freq: Four times a day (QID) | ORAL | Status: DC | PRN
  Administered 2013-07-03: 25 mg via ORAL

## 2013-07-02 MED ORDER — ONDANSETRON HCL 4 MG/2ML IJ SOLN
INTRAMUSCULAR | Status: DC | PRN
  Administered 2013-07-02: 4 mg via INTRAVENOUS

## 2013-07-02 MED ORDER — NALOXONE HCL 0.4 MG/ML IJ SOLN: 0.4000 mg | INTRAMUSCULAR | Status: DC | PRN

## 2013-07-02 MED ORDER — SIMETHICONE 80 MG PO CHEW: 80.0000 mg | CHEWABLE_TABLET | ORAL | Status: DC | PRN

## 2013-07-02 MED ORDER — LACTATED RINGERS IV SOLN
INTRAVENOUS | Status: DC
  Administered 2013-07-02 (×4): via INTRAVENOUS

## 2013-07-02 MED ORDER — DIPHENHYDRAMINE HCL 50 MG/ML IJ SOLN: 12.5000 mg | INTRAMUSCULAR | Status: DC | PRN

## 2013-07-02 MED ORDER — ONDANSETRON HCL 4 MG PO TABS: 4.0000 mg | ORAL_TABLET | ORAL | Status: DC | PRN

## 2013-07-02 MED ORDER — MEPERIDINE HCL 25 MG/ML IJ SOLN: 6.2500 mg | INTRAMUSCULAR | Status: DC | PRN

## 2013-07-02 MED ORDER — BUTORPHANOL TARTRATE 1 MG/ML IJ SOLN
1.0000 mg | INTRAMUSCULAR | Status: DC | PRN
  Administered 2013-07-02: 1 mg via INTRAVENOUS
  Filled 2013-07-02: qty 1

## 2013-07-02 MED ORDER — MORPHINE SULFATE (PF) 0.5 MG/ML IJ SOLN
INTRAMUSCULAR | Status: DC | PRN
Start: 2013-07-02 — End: 2013-07-02
  Administered 2013-07-02: 3 mg via EPIDURAL

## 2013-07-02 MED ORDER — OXYTOCIN 10 UNIT/ML IJ SOLN
40.0000 [IU] | INTRAVENOUS | Status: DC | PRN
  Administered 2013-07-02: 40 [IU] via INTRAVENOUS

## 2013-07-02 MED ORDER — MEASLES, MUMPS & RUBELLA VAC ~~LOC~~ INJ
0.5000 mL | INJECTION | Freq: Once | SUBCUTANEOUS | Status: DC
  Filled 2013-07-02: qty 0.5

## 2013-07-02 MED ORDER — KETOROLAC TROMETHAMINE 30 MG/ML IJ SOLN: 30.0000 mg | Freq: Four times a day (QID) | INTRAMUSCULAR | Status: AC | PRN

## 2013-07-02 MED ORDER — LACTATED RINGERS IV SOLN: 500.0000 mL | INTRAVENOUS | Status: DC | PRN

## 2013-07-02 MED ORDER — PRENATAL MULTIVITAMIN CH
1.0000 | ORAL_TABLET | Freq: Every day | ORAL | Status: DC
  Administered 2013-07-03 – 2013-07-05 (×3): 1 via ORAL
  Filled 2013-07-02 (×3): qty 1

## 2013-07-02 MED ORDER — IBUPROFEN 600 MG PO TABS
600.0000 mg | ORAL_TABLET | Freq: Four times a day (QID) | ORAL | Status: DC
  Administered 2013-07-02 – 2013-07-05 (×12): 600 mg via ORAL
  Filled 2013-07-02 (×12): qty 1

## 2013-07-02 MED ORDER — OXYTOCIN 40 UNITS IN LACTATED RINGERS INFUSION - SIMPLE MED
1.0000 m[IU]/min | INTRAVENOUS | Status: DC
  Administered 2013-07-02: 1 m[IU]/min via INTRAVENOUS

## 2013-07-02 MED ORDER — DIBUCAINE 1 % RE OINT: 1.0000 "application " | TOPICAL_OINTMENT | RECTAL | Status: DC | PRN

## 2013-07-02 MED ORDER — OXYCODONE-ACETAMINOPHEN 5-325 MG PO TABS
1.0000 | ORAL_TABLET | ORAL | Status: DC | PRN
  Administered 2013-07-03 – 2013-07-05 (×6): 1 via ORAL
  Administered 2013-07-05: 2 via ORAL
  Filled 2013-07-02 (×3): qty 1
  Filled 2013-07-02: qty 2
  Filled 2013-07-02 (×4): qty 1

## 2013-07-02 MED ORDER — EPHEDRINE 5 MG/ML INJ
10.0000 mg | INTRAVENOUS | Status: DC | PRN
  Filled 2013-07-02: qty 4

## 2013-07-02 MED ORDER — NALBUPHINE HCL 10 MG/ML IJ SOLN
5.0000 mg | INTRAMUSCULAR | Status: DC | PRN
  Filled 2013-07-02: qty 1

## 2013-07-02 MED ORDER — PENICILLIN G POTASSIUM 5000000 UNITS IJ SOLR
5.0000 10*6.[IU] | Freq: Once | INTRAVENOUS | Status: AC
  Administered 2013-07-02: 5 10*6.[IU] via INTRAVENOUS
  Filled 2013-07-02: qty 5

## 2013-07-02 MED ORDER — PHENYLEPHRINE 40 MCG/ML (10ML) SYRINGE FOR IV PUSH (FOR BLOOD PRESSURE SUPPORT)
80.0000 ug | PREFILLED_SYRINGE | INTRAVENOUS | Status: DC | PRN
  Filled 2013-07-02: qty 10

## 2013-07-02 MED ORDER — CITRIC ACID-SODIUM CITRATE 334-500 MG/5ML PO SOLN
30.0000 mL | ORAL | Status: DC | PRN
  Administered 2013-07-02: 30 mL via ORAL
  Filled 2013-07-02: qty 15

## 2013-07-02 MED ORDER — OXYTOCIN BOLUS FROM INFUSION: 500.0000 mL | INTRAVENOUS | Status: DC

## 2013-07-02 MED ORDER — OXYTOCIN 40 UNITS IN LACTATED RINGERS INFUSION - SIMPLE MED
62.5000 mL/h | INTRAVENOUS | Status: DC
  Filled 2013-07-02: qty 1000

## 2013-07-02 MED ORDER — DIPHENHYDRAMINE HCL 50 MG/ML IJ SOLN: 25.0000 mg | INTRAMUSCULAR | Status: DC | PRN

## 2013-07-02 MED ORDER — OXYTOCIN 40 UNITS IN LACTATED RINGERS INFUSION - SIMPLE MED: 62.5000 mL/h | INTRAVENOUS | Status: AC

## 2013-07-02 MED ORDER — KETOROLAC TROMETHAMINE 30 MG/ML IJ SOLN
INTRAMUSCULAR | Status: AC
  Filled 2013-07-02: qty 1

## 2013-07-02 MED ORDER — MORPHINE SULFATE 0.5 MG/ML IJ SOLN
INTRAMUSCULAR | Status: AC
  Filled 2013-07-02: qty 10

## 2013-07-02 MED ORDER — NALOXONE HCL 1 MG/ML IJ SOLN
1.0000 ug/kg/h | INTRAVENOUS | Status: DC | PRN
  Filled 2013-07-02: qty 2

## 2013-07-02 MED ORDER — SODIUM BICARBONATE 8.4 % IV SOLN
INTRAVENOUS | Status: DC | PRN
  Administered 2013-07-02: 10 mL via EPIDURAL
  Administered 2013-07-02 (×2): 5 mL via EPIDURAL

## 2013-07-02 MED ORDER — LIDOCAINE-EPINEPHRINE (PF) 2 %-1:200000 IJ SOLN
INTRAMUSCULAR | Status: AC
  Filled 2013-07-02: qty 20

## 2013-07-02 MED ORDER — KETOROLAC TROMETHAMINE 30 MG/ML IJ SOLN
30.0000 mg | Freq: Four times a day (QID) | INTRAMUSCULAR | Status: AC | PRN
  Administered 2013-07-02: 30 mg via INTRAMUSCULAR

## 2013-07-02 MED ORDER — METOCLOPRAMIDE HCL 5 MG/ML IJ SOLN
INTRAMUSCULAR | Status: AC
  Filled 2013-07-02: qty 2

## 2013-07-02 MED ORDER — OXYTOCIN 10 UNIT/ML IJ SOLN
INTRAMUSCULAR | Status: AC
  Filled 2013-07-02: qty 4

## 2013-07-02 MED ORDER — ACETAMINOPHEN 325 MG PO TABS: 650.0000 mg | ORAL_TABLET | ORAL | Status: DC | PRN

## 2013-07-02 MED ORDER — SCOPOLAMINE 1 MG/3DAYS TD PT72
MEDICATED_PATCH | TRANSDERMAL | Status: AC
  Filled 2013-07-02: qty 1

## 2013-07-02 MED ORDER — SENNOSIDES-DOCUSATE SODIUM 8.6-50 MG PO TABS
2.0000 | ORAL_TABLET | ORAL | Status: DC
  Administered 2013-07-03 – 2013-07-05 (×2): 2 via ORAL
  Filled 2013-07-02 (×3): qty 2

## 2013-07-02 MED ORDER — MORPHINE SULFATE (PF) 0.5 MG/ML IJ SOLN
INTRAMUSCULAR | Status: DC | PRN
Start: 2013-07-02 — End: 2013-07-02
  Administered 2013-07-02: 2 mg via INTRAVENOUS

## 2013-07-02 MED ORDER — PENICILLIN G POTASSIUM 5000000 UNITS IJ SOLR
2.5000 10*6.[IU] | INTRAMUSCULAR | Status: DC
  Administered 2013-07-02 (×2): 2.5 10*6.[IU] via INTRAVENOUS
  Filled 2013-07-02 (×6): qty 2.5

## 2013-07-02 MED ORDER — MENTHOL 3 MG MT LOZG: 1.0000 | LOZENGE | OROMUCOSAL | Status: DC | PRN

## 2013-07-02 MED ORDER — LIDOCAINE HCL (PF) 1 % IJ SOLN: 30.0000 mL | INTRAMUSCULAR | Status: DC | PRN

## 2013-07-02 MED ORDER — SODIUM CHLORIDE 0.9 % IJ SOLN: 3.0000 mL | INTRAMUSCULAR | Status: DC | PRN

## 2013-07-02 MED ORDER — LACTATED RINGERS IV SOLN: 500.0000 mL | Freq: Once | INTRAVENOUS | Status: DC

## 2013-07-02 MED ORDER — ZOLPIDEM TARTRATE 5 MG PO TABS: 5.0000 mg | ORAL_TABLET | Freq: Every evening | ORAL | Status: DC | PRN

## 2013-07-02 MED ORDER — SIMETHICONE 80 MG PO CHEW
80.0000 mg | CHEWABLE_TABLET | ORAL | Status: DC
  Administered 2013-07-03: 23:00:00 via ORAL
  Administered 2013-07-05: 80 mg via ORAL
  Filled 2013-07-02 (×3): qty 1

## 2013-07-02 MED ORDER — SODIUM BICARBONATE 8.4 % IV SOLN
INTRAVENOUS | Status: AC
  Filled 2013-07-02: qty 50

## 2013-07-02 MED ORDER — DIPHENHYDRAMINE HCL 25 MG PO CAPS
25.0000 mg | ORAL_CAPSULE | ORAL | Status: DC | PRN
  Filled 2013-07-02: qty 1

## 2013-07-02 MED ORDER — MAGNESIUM HYDROXIDE 400 MG/5ML PO SUSP: 30.0000 mL | ORAL | Status: DC | PRN

## 2013-07-02 MED ORDER — IBUPROFEN 600 MG PO TABS: 600.0000 mg | ORAL_TABLET | Freq: Four times a day (QID) | ORAL | Status: DC | PRN

## 2013-07-02 MED ORDER — METOCLOPRAMIDE HCL 5 MG/ML IJ SOLN
10.0000 mg | Freq: Three times a day (TID) | INTRAMUSCULAR | Status: DC | PRN
  Administered 2013-07-02: 10 mg via INTRAVENOUS

## 2013-07-02 MED ORDER — TETANUS-DIPHTH-ACELL PERTUSSIS 5-2.5-18.5 LF-MCG/0.5 IM SUSP: 0.5000 mL | Freq: Once | INTRAMUSCULAR | Status: DC

## 2013-07-02 MED ORDER — OXYCODONE-ACETAMINOPHEN 5-325 MG PO TABS: 1.0000 | ORAL_TABLET | ORAL | Status: DC | PRN

## 2013-07-02 MED ORDER — FENTANYL 2.5 MCG/ML BUPIVACAINE 1/10 % EPIDURAL INFUSION (WH - ANES)
14.0000 mL/h | INTRAMUSCULAR | Status: DC | PRN
  Administered 2013-07-02: 14 mL/h via EPIDURAL
  Filled 2013-07-02: qty 125

## 2013-07-02 MED ORDER — ONDANSETRON HCL 4 MG/2ML IJ SOLN
INTRAMUSCULAR | Status: AC
  Filled 2013-07-02: qty 2

## 2013-07-02 MED ORDER — ONDANSETRON HCL 4 MG/2ML IJ SOLN: 4.0000 mg | Freq: Four times a day (QID) | INTRAMUSCULAR | Status: DC | PRN

## 2013-07-02 MED ORDER — PROMETHAZINE HCL 25 MG/ML IJ SOLN: 6.2500 mg | INTRAMUSCULAR | Status: DC | PRN

## 2013-07-02 MED ORDER — ONDANSETRON HCL 4 MG/2ML IJ SOLN
4.0000 mg | INTRAMUSCULAR | Status: DC | PRN
  Administered 2013-07-02: 4 mg via INTRAVENOUS

## 2013-07-02 MED ORDER — WITCH HAZEL-GLYCERIN EX PADS: 1.0000 "application " | MEDICATED_PAD | CUTANEOUS | Status: DC | PRN

## 2013-07-02 MED ORDER — CEFAZOLIN SODIUM-DEXTROSE 2-3 GM-% IV SOLR
INTRAVENOUS | Status: AC
  Filled 2013-07-02: qty 50

## 2013-07-02 MED ORDER — LANOLIN HYDROUS EX OINT: 1.0000 "application " | TOPICAL_OINTMENT | CUTANEOUS | Status: DC | PRN

## 2013-07-02 MED ORDER — SIMETHICONE 80 MG PO CHEW
80.0000 mg | CHEWABLE_TABLET | Freq: Three times a day (TID) | ORAL | Status: DC
  Administered 2013-07-02 – 2013-07-05 (×8): 80 mg via ORAL
  Filled 2013-07-02 (×8): qty 1

## 2013-07-02 MED ORDER — MEPERIDINE HCL 25 MG/ML IJ SOLN
INTRAMUSCULAR | Status: DC | PRN
Start: 2013-07-02 — End: 2013-07-02
  Administered 2013-07-02: 25 mg via INTRAVENOUS

## 2013-07-02 SURGICAL SUPPLY — 33 items
CLAMP CORD UMBIL (MISCELLANEOUS) IMPLANT
CLOTH BEACON ORANGE TIMEOUT ST (SAFETY) ×2 IMPLANT
CONTAINER PREFILL 10% NBF 15ML (MISCELLANEOUS) IMPLANT
DRAPE LG THREE QUARTER DISP (DRAPES) ×4 IMPLANT
DRSG OPSITE POSTOP 4X10 (GAUZE/BANDAGES/DRESSINGS) ×2 IMPLANT
DURAPREP 26ML APPLICATOR (WOUND CARE) ×2 IMPLANT
ELECT REM PT RETURN 9FT ADLT (ELECTROSURGICAL) ×2
ELECTRODE REM PT RTRN 9FT ADLT (ELECTROSURGICAL) ×1 IMPLANT
EXTRACTOR VACUUM KIWI (MISCELLANEOUS) IMPLANT
EXTRACTOR VACUUM M CUP 4 TUBE (SUCTIONS) IMPLANT
GLOVE BIO SURGEON STRL SZ8 (GLOVE) ×2 IMPLANT
GLOVE ORTHO TXT STRL SZ7.5 (GLOVE) ×2 IMPLANT
GOWN PREVENTION PLUS XLARGE (GOWN DISPOSABLE) ×2 IMPLANT
GOWN STRL REIN XL XLG (GOWN DISPOSABLE) ×2 IMPLANT
KIT ABG SYR 3ML LUER SLIP (SYRINGE) IMPLANT
NDL HYPO 25X5/8 SAFETYGLIDE (NEEDLE) ×1 IMPLANT
NEEDLE HYPO 25X5/8 SAFETYGLIDE (NEEDLE) ×2 IMPLANT
NS IRRIG 1000ML POUR BTL (IV SOLUTION) ×2 IMPLANT
PACK C SECTION WH (CUSTOM PROCEDURE TRAY) ×2 IMPLANT
PAD OB MATERNITY 4.3X12.25 (PERSONAL CARE ITEMS) ×2 IMPLANT
RTRCTR C-SECT PINK 25CM LRG (MISCELLANEOUS) ×2 IMPLANT
STAPLER VISISTAT 35W (STAPLE) ×1 IMPLANT
SUT CHROMIC 1 CTX 36 (SUTURE) ×5 IMPLANT
SUT PLAIN 0 NONE (SUTURE) IMPLANT
SUT PLAIN 2 0 (SUTURE) ×2
SUT PLAIN 2 0 XLH (SUTURE) IMPLANT
SUT PLAIN ABS 2-0 CT1 27XMFL (SUTURE) IMPLANT
SUT VIC AB 0 CT1 27 (SUTURE) ×4
SUT VIC AB 0 CT1 27XBRD ANBCTR (SUTURE) ×2 IMPLANT
SUT VIC AB 4-0 KS 27 (SUTURE) IMPLANT
TOWEL OR 17X24 6PK STRL BLUE (TOWEL DISPOSABLE) ×3 IMPLANT
TRAY FOLEY CATH 14FR (SET/KITS/TRAYS/PACK) ×1 IMPLANT
WATER STERILE IRR 1000ML POUR (IV SOLUTION) ×2 IMPLANT

## 2013-07-02 NOTE — Consult Note (Signed)
Neonatology Note:  Attendance at C-section:  I was asked by Dr. Meisinger to attend this primary C/S at 38 6/7 weeks due to breech presentation. The mother is a G1P0 B pos, GBS positive with GDM on glyburide. She received 3 doses of IV antibiotic prior to delivery. ROM 12 hours before delivery, fluid clear. Infant vigorous with good spontaneous cry and tone. Needed only minimal bulb suctioning. Ap 9/9. Lungs clear to ausc in DR. To CN to care of Pediatrician.  Toni Morgan C. Nathon Stefanski, MD  

## 2013-07-02 NOTE — Anesthesia Preprocedure Evaluation (Signed)
Anesthesia Evaluation  Patient identified by MRN, date of birth, ID band Patient awake    Reviewed: Allergy & Precautions, H&P , Patient's Chart, lab work & pertinent test results  Airway Mallampati: II TM Distance: >3 FB Neck ROM: full    Dental no notable dental hx.    Pulmonary neg pulmonary ROS,  breath sounds clear to auscultation  Pulmonary exam normal       Cardiovascular hypertension, negative cardio ROS  Rhythm:regular Rate:Normal     Neuro/Psych negative neurological ROS  negative psych ROS   GI/Hepatic negative GI ROS, Neg liver ROS,   Endo/Other  negative endocrine ROSdiabetes  Renal/GU negative Renal ROS     Musculoskeletal   Abdominal   Peds  Hematology negative hematology ROS (+)   Anesthesia Other Findings   Reproductive/Obstetrics (+) Pregnancy                           Anesthesia Physical Anesthesia Plan  ASA: III  Anesthesia Plan: Epidural   Post-op Pain Management:    Induction:   Airway Management Planned:   Additional Equipment:   Intra-op Plan:   Post-operative Plan:   Informed Consent: I have reviewed the patients History and Physical, chart, labs and discussed the procedure including the risks, benefits and alternatives for the proposed anesthesia with the patient or authorized representative who has indicated his/her understanding and acceptance.     Plan Discussed with:   Anesthesia Plan Comments:         Anesthesia Quick Evaluation

## 2013-07-02 NOTE — H&P (Signed)
Toni Morgan is a 27 y.o. female G1P0 at 63 6/7weeks (EDD 07/10/13 by LMP c/w 18 week [place]) presenting for SROM about midnight.  Prenatal care significant for preterm dilation at 29 weeks managed with bedrest.  She remained stable at 2 cm until 35-36 weeks when began to gradually dilate more.  Her exam in the office today was 5cm dilated.  There was a placenta previa early on that resolved on f/u US.  She has gestational diabetes and BS have been fairly controlled on glyburide 7.5 mg po q AM.  NST's have been reassuring.    Maternal Medical History:  Contractions: Frequency: regular.   Perceived severity is mild.    Fetal activity: Perceived fetal activity is normal.    Prenatal complications: Preterm labor.   Prenatal Complications - Diabetes: gestational. Diabetes is managed by oral agent (monotherapy).      OB History   Grav Para Term Preterm Abortions TAB SAB Ect Mult Living   1         0     Past Medical History  Diagnosis Date  . Medical history non-contributory   . Gestational diabetes    Past Surgical History  Procedure Laterality Date  . Hernia repair    . Wisdom tooth extraction     Family History: family history includes Cancer in her maternal grandmother; Diabetes in her maternal uncle. Social History:  reports that she has never smoked. She has never used smokeless tobacco. She reports that she drinks alcohol. She reports that she does not use illicit drugs.   Prenatal Transfer Tool  Maternal Diabetes: Yes:  Diabetes Type:  Insulin/Medication controlled Genetic Screening: Normal Maternal Ultrasounds/Referrals: Normal Fetal Ultrasounds or other Referrals:  None Maternal Substance Abuse:  No Significant Maternal Medications:  Meds include: Other: Glyburide Significant Maternal Lab Results:  Lab values include: Group B Strep positive Other Comments:  None  ROS    Blood pressure 147/89, pulse 119, temperature 98.4 F (36.9 C), temperature source Oral, resp. rate  18, last menstrual period 10/03/2012. Maternal Exam:  Uterine Assessment: Contraction strength is mild.  Contraction frequency is regular.   Abdomen: Patient reports no abdominal tenderness. Fetal presentation: vertex  Introitus: Normal vulva. Normal vagina.  Pelvis: adequate for delivery.      Physical Exam  Constitutional: She is oriented to person, place, and time. She appears well-developed and well-nourished.  Cardiovascular: Normal rate and regular rhythm.   Respiratory: Effort normal and breath sounds normal.  GI: Soft.  Genitourinary: Vagina normal and uterus normal.  Neurological: She is alert and oriented to person, place, and time.  Psychiatric: She has a normal mood and affect. Her behavior is normal.    Prenatal labs: ABO, Rh: --/--/B POS (03/11 1210) Antibody:  Negative Rubella:  Immune RPR:   NR HBsAg:   Neg HIV:   NR GBS:   Positive One hour GTT 197 Three hour GTT 102/202/200/211 Hgb AA CF neg    Assessment/Plan: Pt with ROM and advanced cervical dilitation, contracting but not particularly strong yet.  Will Augment with pitocin as needed. Epidural prn.  Oliver Pila 07/02/2013, 12:53 AM

## 2013-07-02 NOTE — Transfer of Care (Signed)
Immediate Anesthesia Transfer of Care Note  Patient: Toni Morgan  Procedure(s) Performed: Procedure(s): CESAREAN SECTION (N/A)  Patient Location: PACU  Anesthesia Type:Epidural  Level of Consciousness: sedated  Airway & Oxygen Therapy: Patient Spontanous Breathing  Post-op Assessment: Report given to PACU RN  Post vital signs: Reviewed and stable  Complications: No apparent anesthesia complications

## 2013-07-02 NOTE — Anesthesia Procedure Notes (Signed)
Epidural Patient location during procedure: OB Start time: 07/02/2013 7:41 AM  Staffing Anesthesiologist: Brayton Caves Performed by: anesthesiologist   Preanesthetic Checklist Completed: patient identified, site marked, surgical consent, pre-op evaluation, timeout performed, IV checked, risks and benefits discussed and monitors and equipment checked  Epidural Patient position: sitting Prep: site prepped and draped and DuraPrep Patient monitoring: continuous pulse ox and blood pressure Approach: midline Injection technique: LOR air  Needle:  Needle type: Tuohy  Needle gauge: 17 G Needle length: 9 cm and 9 Needle insertion depth: 6 cm Catheter type: closed end flexible Catheter size: 19 Gauge Catheter at skin depth: 11 cm Test dose: negative  Assessment Events: blood not aspirated, injection not painful, no injection resistance, negative IV test and no paresthesia  Additional Notes Patient identified.  Risk benefits discussed including failed block, incomplete pain control, headache, nerve damage, paralysis, blood pressure changes, nausea, vomiting, reactions to medication both toxic or allergic, and postpartum back pain.  Patient expressed understanding and wished to proceed.  All questions were answered.  Sterile technique used throughout procedure and epidural site dressed with sterile barrier dressing. No paresthesia or other complications noted.The patient did not experience any signs of intravascular injection such as tinnitus or metallic taste in mouth nor signs of intrathecal spread such as rapid motor block. Please see nursing notes for vital signs.

## 2013-07-02 NOTE — MAU Note (Signed)
Pt reports leaking of fluid @ 0000 when she sat down. Clear fluid. Pt reports that she was 5-6 cm in office today. GDM on glyburide. +GBS per pt report

## 2013-07-02 NOTE — Lactation Note (Signed)
This note was copied from the chart of Toni [facility]. Lactation Consultation Note Initial visit at 5 hours of age requested by nursery RN due to baby having previous low blood sugar.  Baby is skin to skin with mom, baby sleeping.  Attempted latch baby not interested.  Suck training with gloved finger with only a weak suck intermittent.  Baby has a high soft palate.  Mom has semi flat nipples.  Multiple attempts to get baby latched.  Baby with latch and few sucks with constant breast massage and compression to encourage suckle continued for 5 minutes.  No pattern established with sucks.  Hand expressed for several minutes to get approximately 3 mls of colostrum and spoon fed to baby.  Baby very sleepy and not licking at spoon.  Baby placed back in basinet per moms request who is struggling to keep her eyes open.  Report given to nursery Rn, Tana Coast.  [facility] LC resources given and discussed with mom.  Encouraged to feed with cues or when RN advises due to blood sugar issues.  Mom to call for assist as needed.    Patient Name: Toni Morgan [id number] Date: 07/02/2013 Reason for consult: Initial assessment   Maternal Data Formula Feeding for Exclusion: No Infant to breast within first hour of birth: Yes Has patient been taught Hand Expression?: Yes Does the patient have breastfeeding experience prior to this delivery?: No  Feeding Feeding Type: Breast Fed Length of feed:  (few sucks)  LATCH Score/Interventions Latch: Repeated attempts needed to sustain latch, nipple held in mouth throughout feeding, stimulation needed to elicit sucking reflex. Intervention(s): Skin to skin;Teach feeding cues;Waking techniques Intervention(s): Breast massage;Breast compression;Assist with latch  Audible Swallowing: None Intervention(s): Skin to skin;Hand expression  Type of Nipple: Flat Intervention(s): No intervention needed  Comfort (Breast/Nipple): Soft / non-tender     Hold (Positioning): No  assistance needed to correctly position infant at breast. Intervention(s): Skin to skin;Support Pillows;Breastfeeding basics reviewed  LATCH Score: 6  Lactation Tools Discussed/Used     Consult Status Consult Status: Follow-up Date: 07/03/13 Follow-up type: In-patient    Jannifer Rodney 07/02/2013, 4:57 PM

## 2013-07-02 NOTE — Progress Notes (Signed)
Comfortable with epidural Afeb, VSS FHT- Cat I, ctx q 2-3 min VE-6/90/-2, vtx Will continue pitocin, continue PCN for +GBS, will check CBG-no checks since admission.

## 2013-07-02 NOTE — Op Note (Signed)
Preoperative diagnosis: Intrauterine pregnancy at 38 weeks, breech presentation, ruptured membranes Postoperative diagnosis: Same Procedure: Primary low transverse cesarean section without extensions Surgeon: Lavina Hamman M.D. Anesthesia: Epidural   Findings: Patient had normal gravid anatomy and delivered a viable female infant with Apgars of 9 and 9 weight pending, frank breech presentation Estimated blood loss: 1000 cc Specimens: Placenta sent to labor and delivery Complications: None  Procedure in detail: The patient was taken to the operating room and placed in the dorsosupine position. Her previously placed epidural was dosed appropriately.  Abdomen was then prepped and draped in the usual sterile fashion, and a foley catheter was inserted. The level of her anesthesia was found to be adequate. Abdomen was entered via a standard Pfannenstiel incision. Once the peritoneal cavity was entered the Alexis disposable self-retaining retractor was placed and good visualization was achieved. A 4 cm transverse incision was then made in the lower uterine segment pushing the bladder inferior. Once the uterine cavity was entered the incision was extended digitally. The fetal breech was grasped and delivered through the incision atraumatically, followed by the torso and the arms.  The head then easily delivered. Mouth and nares were suctioned. Cord was doubly clamped and cut and the infant handed to the awaiting pediatric team. Cord blood was obtained. The placenta delivered spontaneously. Uterus was wiped dry with clean lap pad and all clots and debris were removed. Uterine incision was inspected and found to be free of extensions. Bleeding from the posterior uterine bed was controlled with running locking #1 Chromic.  Uterine incision was closed in 2 layers with running locking #1 Chromic for the first layer, an imbricating layer of #1 Chromic for the second layer. Tubes and ovaries were inspected and found to  be normal. Uterine incision was inspected and found to be hemostatic. Bleeding from serosal edges was controlled with electrocautery. The Alexis retractor was removed. Subfascial space was irrigated and made hemostatic with electrocautery. Peritoneum was closed with running 2-0 Vicryl.  Fascia was closed in running fashion starting at both ends and meeting in the middle with 0 Vicryl. Subcutaneous tissue was then irrigated and made hemostatic with electrocautery, then closed with running 2-0 plain gut. Skin was closed with staples followed by a sterile dressing. Patient tolerated the procedure well and was taken to the recovery in stable condition. Counts were correct x2, she received Ancef 2 g IV at the beginning of the procedure and she had PAS hose on throughout the procedure.

## 2013-07-02 NOTE — Anesthesia Postprocedure Evaluation (Signed)
Anesthesia Post Note  Patient: Toni Morgan  Procedure(s) Performed: Procedure(s) (LRB): CESAREAN SECTION (N/A)  Anesthesia type: Epidural  Patient location: PACU  Post pain: Pain level controlled  Post assessment: Post-op Vital signs reviewed  Last Vitals:  Filed Vitals:   07/02/13 1330  BP: 114/66  Pulse: 97  Temp:   Resp: 16    Post vital signs: Reviewed  Level of consciousness: awake  Complications: No apparent anesthesia complications

## 2013-07-02 NOTE — Progress Notes (Signed)
VE-7/C/-1, does not palpate vtx Breech by ultrasound Discussed situation with pt, discussed c-section procedure and risks, will proceed with c-section

## 2013-07-03 ENCOUNTER — Encounter (HOSPITAL_COMMUNITY): Payer: Self-pay | Admitting: Obstetrics and Gynecology

## 2013-07-03 LAB — CBC
HCT: 25.8 % — ABNORMAL LOW (ref 36.0–46.0)
Hemoglobin: 8.7 g/dL — ABNORMAL LOW (ref 12.0–15.0)
MCH: 26 pg (ref 26.0–34.0)
MCHC: 33.7 g/dL (ref 30.0–36.0)
MCV: 77 fL — ABNORMAL LOW (ref 78.0–100.0)
Platelets: 198 10*3/uL (ref 150–400)
RBC: 3.35 MIL/uL — ABNORMAL LOW (ref 3.87–5.11)
RDW: 14.4 % (ref 11.5–15.5)
WBC: 11.6 10*3/uL — ABNORMAL HIGH (ref 4.0–10.5)

## 2013-07-03 LAB — GLUCOSE, CAPILLARY
Glucose-Capillary: 83 mg/dL (ref 70–99)
Glucose-Capillary: 94 mg/dL (ref 70–99)
Glucose-Capillary: 108 mg/dL — ABNORMAL HIGH (ref 70–99)
Glucose-Capillary: 136 mg/dL — ABNORMAL HIGH (ref 70–99)
Glucose-Capillary: 158 mg/dL — ABNORMAL HIGH (ref 70–99)

## 2013-07-03 NOTE — Progress Notes (Signed)
UR chart review completed.  

## 2013-07-03 NOTE — Progress Notes (Signed)
Subjective: Postpartum Day #1: Cesarean Delivery Patient reports tolerating PO and no problems voiding.    Objective: Vital signs in last 24 hours: Temp:  [97.6 F (36.4 C)-98.7 F (37.1 C)] 98.7 F (37.1 C) (10/30 9604) Pulse Rate:  [92-126] 99 (10/30 0638) Resp:  [14-22] 18 (10/30 0638) BP: (97-138)/(55-92) 122/72 mmHg (10/30 0638) SpO2:  [97 %-100 %] 97 % (10/30 5409)  Physical Exam:  General: alert Lochia: appropriate Uterine Fundus: firm Incision: dressing C/D/I   Recent Labs  07/02/13 1313 07/03/13 0635  HGB 10.1* 8.7*  HCT 30.0* 25.8*    Assessment/Plan: Status post Cesarean section. Doing well postoperatively.  Continue current care, ambulate.  Deklyn Gibbon D 07/03/2013, 10:32 AM

## 2013-07-03 NOTE — Anesthesia Postprocedure Evaluation (Signed)
  Anesthesia Post-op Note  Patient: SHAHAD MAZUREK  Procedure(s) Performed: Procedure(s): CESAREAN SECTION (N/A)  Patient Location: PACU and Mother/Baby  Anesthesia Type:Epidural  Level of Consciousness: awake, alert  and oriented  Airway and Oxygen Therapy: Patient Spontanous Breathing  Post-op Pain: mild  Post-op Assessment: Patient's Cardiovascular Status Stable, Respiratory Function Stable, No signs of Nausea or vomiting, Adequate PO intake and Pain level controlled  Post-op Vital Signs: stable  Complications: No apparent anesthesia complications Anesthesia Post-op Note  Patient: EMMRY HINSCH  Procedure(s) Performed: Procedure(s): CESAREAN SECTION (N/A)  Patient Location: PACU and Mother/Baby  Anesthesia Type:Epidural  Level of Consciousness: awake, alert  and oriented  Airway and Oxygen Therapy: Patient Spontanous Breathing  Post-op Pain: mild  Post-op Assessment: Patient's Cardiovascular Status Stable, Respiratory Function Stable, No signs of Nausea or vomiting, Adequate PO intake and Pain level controlled  Post-op Vital Signs: stable  Complications: No apparent anesthesia complications

## 2013-07-04 LAB — GLUCOSE, CAPILLARY
Glucose-Capillary: 92 mg/dL (ref 70–99)
Glucose-Capillary: 128 mg/dL — ABNORMAL HIGH (ref 70–99)
Glucose-Capillary: 137 mg/dL — ABNORMAL HIGH (ref 70–99)
Glucose-Capillary: 114 mg/dL — ABNORMAL HIGH (ref 70–99)

## 2013-07-04 NOTE — Progress Notes (Signed)
POD #2 Doing well, no problems Afeb, VSS Abd- soft, fundus firm, incision intact Continue routine care, she may request discharge later today

## 2013-07-04 NOTE — Clinical Social Work Maternal (Signed)
    Clinical Social Work Department PSYCHOSOCIAL ASSESSMENT - MATERNAL/CHILD 07/04/2013  Patient:  Toni Morgan, Toni Morgan  Account Number:  [id number]  Admit Date:  07/02/2013  Marjo Bicker Name:   Kae Heller    Clinical Social Worker:  Nobie Putnam, LCSW   Date/Time:  07/04/2013 11:10 AM  Date Referred:  07/04/2013   Referral source  CN     Referred reason  Substance Abuse   Other referral source:    I:  FAMILY / HOME ENVIRONMENT Child's legal guardian:  PARENT  Guardian - Name Guardian - Age Guardian - Address  New Home [address] [place], [place] [address]  Christoper Fabian 28    Other household support members/support persons Other support:   Wende Neighbors, mother    II  PSYCHOSOCIAL DATA Information Source:  Patient Interview  Event organiser Employment:   Financial resources:  [employer] If Medicaid - County:  [place] Other  Food Stamps  WIC   School / Grade:   Maternity Care Coordinator / Child Services Coordination / Early Interventions:  Cultural issues impacting care:    III  STRENGTHS Strengths  Adequate Resources  Home prepared for Child (including basic supplies)  Supportive family/friends   Strength comment:    IV  RISK FACTORS AND CURRENT PROBLEMS Current Problem:  None     V  SOCIAL WORK ASSESSMENT CSW referral received to assess "history of MJ."  Pt adamantly denies any MJ use in her life.  She told CSW that she never smoked MJ in her life & states our information is wrong.  CSW was not able to find any documentation in pt's chart indicating MJ history.  CSW explained hospital drug testing policy but pt states she is not worried about results.  UDS is negative, meconium results are pending. She has all the necessary supplies for the infant & good family support.  CSW will continue to monitor drug screen results & make a referral if needed.      VI SOCIAL WORK PLAN Social Work Plan  No Further Intervention Required / No  Barriers to Discharge   Type of pt/family education:   If child protective services report - county:   If child protective services report - date:   Information/referral to community resources comment:   Other social work plan:

## 2013-07-05 LAB — GLUCOSE, CAPILLARY
Glucose-Capillary: 70 mg/dL (ref 70–99)
Glucose-Capillary: 97 mg/dL (ref 70–99)

## 2013-07-05 MED ORDER — OXYCODONE-ACETAMINOPHEN 5-325 MG PO TABS: 1.0000 | ORAL_TABLET | ORAL | Status: DC | PRN

## 2013-07-05 MED ORDER — IBUPROFEN 600 MG PO TABS: 600.0000 mg | ORAL_TABLET | Freq: Four times a day (QID) | ORAL | Status: DC

## 2013-07-05 NOTE — Discharge Summary (Signed)
Obstetric Discharge Summary Reason for Admission: rupture of membranes Prenatal Procedures: ultrasound Intrapartum Procedures: cesarean: low cervical, transverse Postpartum Procedures: none Complications-Operative and Postpartum: none Hemoglobin  Date Value Range Status  07/03/2013 8.7* 12.0 - 15.0 g/dL Final     HCT  Date Value Range Status  07/03/2013 25.8* 36.0 - 46.0 % Final    Physical Exam:  General: alert and cooperative Lochia: appropriate Uterine Fundus: firm Incision: healing well, dressing intact  Discharge Diagnoses: Term Pregnancy-delivered                                         Breech in labor Discharge Information: Date: 07/05/2013 Activity: pelvic rest Diet: routine Medications: Ibuprofen and Percocet Condition: improved Instructions: refer to practice specific booklet Discharge to: home Follow-up Information   Follow up with MEISINGER,TODD D, MD. Schedule an appointment as soon as possible for a visit in 3 days. (staple removal)    Specialty:  Obstetrics and Gynecology   Contact information:   [address], [address] [place] [address] [phone]       Newborn Data: Live born female  Birth Weight: 8 lb 10.8 oz (3935 g) APGAR: 9, 9  Home with mother. Plans circumcision in office  Zackrey Dyar W 07/05/2013, 9:56 AM

## 2013-07-05 NOTE — Progress Notes (Signed)
Subjective: Postpartum Day 3 Cesarean Delivery Patient reports tolerating PO and no problems voiding.    Objective: Vital signs in last 24 hours: Temp:  [97.4 F (36.3 C)-97.8 F (36.6 C)] 97.8 F (36.6 C) (11/01 0616) Pulse Rate:  [95-99] 95 (11/01 0616) Resp:  [16-18] 18 (11/01 0616) BP: (132-140)/(88-89) 132/88 mmHg (11/01 0616) SpO2:  [98 %-100 %] 98 % (11/01 0616)  Physical Exam:  General: alert and cooperative Lochia: appropriate Uterine Fundus: firm Incision: C/D/I    Recent Labs  07/02/13 1313 07/03/13 0635  HGB 10.1* 8.7*  HCT 30.0* 25.8*    Assessment/Plan: Status post Cesarean section. Doing well postoperatively.  Discharge home with standard precautions and return to office in 3-4 days for staple removal.  Shakiya Mcneary W 07/05/2013, 9:54 AM

## 2014-05-14 ENCOUNTER — Emergency Department (HOSPITAL_BASED_OUTPATIENT_CLINIC_OR_DEPARTMENT_OTHER): Payer: BC Managed Care – PPO

## 2014-05-14 ENCOUNTER — Encounter (HOSPITAL_BASED_OUTPATIENT_CLINIC_OR_DEPARTMENT_OTHER): Payer: Self-pay | Admitting: Emergency Medicine

## 2014-05-14 ENCOUNTER — Emergency Department (HOSPITAL_BASED_OUTPATIENT_CLINIC_OR_DEPARTMENT_OTHER)
Admission: EM | Admit: 2014-05-14 | Discharge: 2014-05-14 | Disposition: A | Discharge status: Home / Self Care | Payer: BC Managed Care – PPO | Attending: Emergency Medicine | Admitting: Emergency Medicine

## 2014-05-14 DIAGNOSIS — Z791 Long term (current) use of non-steroidal anti-inflammatories (NSAID): Secondary | ICD-10-CM | POA: Insufficient documentation

## 2014-05-14 DIAGNOSIS — Z3202 Encounter for pregnancy test, result negative: Secondary | ICD-10-CM | POA: Diagnosis not present

## 2014-05-14 DIAGNOSIS — Z79899 Other long term (current) drug therapy: Secondary | ICD-10-CM | POA: Diagnosis not present

## 2014-05-14 DIAGNOSIS — Z9889 Other specified postprocedural states: Secondary | ICD-10-CM | POA: Insufficient documentation

## 2014-05-14 DIAGNOSIS — R109 Unspecified abdominal pain: Secondary | ICD-10-CM | POA: Diagnosis present

## 2014-05-14 DIAGNOSIS — K5289 Other specified noninfective gastroenteritis and colitis: Secondary | ICD-10-CM | POA: Insufficient documentation

## 2014-05-14 DIAGNOSIS — R911 Solitary pulmonary nodule: Secondary | ICD-10-CM

## 2014-05-14 DIAGNOSIS — K529 Noninfective gastroenteritis and colitis, unspecified: Secondary | ICD-10-CM

## 2014-05-14 DIAGNOSIS — Z8632 Personal history of gestational diabetes: Secondary | ICD-10-CM | POA: Insufficient documentation

## 2014-05-14 LAB — COMPREHENSIVE METABOLIC PANEL
ALT: 13 U/L (ref 0–35)
AST: 18 U/L (ref 0–37)
Albumin: 3.8 g/dL (ref 3.5–5.2)
Alkaline Phosphatase: 89 U/L (ref 39–117)
Anion gap: 10 (ref 5–15)
BUN: 13 mg/dL (ref 6–23)
CO2: 25 mEq/L (ref 19–32)
Calcium: 9.8 mg/dL (ref 8.4–10.5)
Chloride: 105 mEq/L (ref 96–112)
Creatinine, Ser: 1 mg/dL (ref 0.50–1.10)
GFR calc Af Amer: 89 mL/min — ABNORMAL LOW (ref >90)
GFR calc non Af Amer: 76 mL/min — ABNORMAL LOW (ref >90)
Glucose, Bld: 93 mg/dL (ref 70–99)
Potassium: 4.1 mEq/L (ref 3.7–5.3)
Sodium: 140 mEq/L (ref 137–147)
Total Bilirubin: 0.4 mg/dL (ref 0.3–1.2)
Total Protein: 7.7 g/dL (ref 6.0–8.3)

## 2014-05-14 LAB — CBC WITH DIFFERENTIAL/PLATELET
Basophils Absolute: 0 10*3/uL (ref 0.0–0.1)
Basophils Relative: 0 % (ref 0–1)
Eosinophils Absolute: 0.2 10*3/uL (ref 0.0–0.7)
Eosinophils Relative: 2 % (ref 0–5)
HCT: 41.7 % (ref 36.0–46.0)
Hemoglobin: 14.1 g/dL (ref 12.0–15.0)
Lymphocytes Relative: 22 % (ref 12–46)
Lymphs Abs: 2.1 10*3/uL (ref 0.7–4.0)
MCH: 27.5 pg (ref 26.0–34.0)
MCHC: 33.8 g/dL (ref 30.0–36.0)
MCV: 81.4 fL (ref 78.0–100.0)
Monocytes Absolute: 0.7 10*3/uL (ref 0.1–1.0)
Monocytes Relative: 7 % (ref 3–12)
Neutro Abs: 6.5 10*3/uL (ref 1.7–7.7)
Neutrophils Relative %: 69 % (ref 43–77)
Platelets: 290 10*3/uL (ref 150–400)
RBC: 5.12 MIL/uL — ABNORMAL HIGH (ref 3.87–5.11)
RDW: 13.2 % (ref 11.5–15.5)
WBC: 9.5 10*3/uL (ref 4.0–10.5)

## 2014-05-14 LAB — URINALYSIS, ROUTINE W REFLEX MICROSCOPIC
Bilirubin Urine: NEGATIVE
Glucose, UA: NEGATIVE mg/dL
Hgb urine dipstick: NEGATIVE
Ketones, ur: NEGATIVE mg/dL
Nitrite: NEGATIVE
Protein, ur: NEGATIVE mg/dL
Specific Gravity, Urine: 1.028 (ref 1.005–1.030)
Urobilinogen, UA: 0.2 mg/dL (ref 0.0–1.0)
pH: 6 (ref 5.0–8.0)

## 2014-05-14 LAB — URINE MICROSCOPIC-ADD ON

## 2014-05-14 LAB — LIPASE, BLOOD: Lipase: 21 U/L (ref 11–59)

## 2014-05-14 LAB — PREGNANCY, URINE: Preg Test, Ur: NEGATIVE

## 2014-05-14 MED ORDER — IOHEXOL 300 MG/ML  SOLN
100.0000 mL | Freq: Once | INTRAMUSCULAR | Status: AC | PRN
  Administered 2014-05-14: 100 mL via INTRAVENOUS

## 2014-05-14 MED ORDER — SODIUM CHLORIDE 0.9 % IV SOLN: INTRAVENOUS | Status: DC

## 2014-05-14 MED ORDER — IOHEXOL 300 MG/ML  SOLN
50.0000 mL | Freq: Once | INTRAMUSCULAR | Status: AC | PRN
  Administered 2014-05-14: 50 mL via ORAL

## 2014-05-14 MED ORDER — ONDANSETRON HCL 4 MG/2ML IJ SOLN
4.0000 mg | Freq: Once | INTRAMUSCULAR | Status: AC
  Administered 2014-05-14: 4 mg via INTRAVENOUS
  Filled 2014-05-14: qty 2

## 2014-05-14 MED ORDER — SODIUM CHLORIDE 0.9 % IV BOLUS (SEPSIS)
250.0000 mL | Freq: Once | INTRAVENOUS | Status: AC
  Administered 2014-05-14: 250 mL via INTRAVENOUS

## 2014-05-14 MED ORDER — HYDROCODONE-ACETAMINOPHEN 5-325 MG PO TABS: 1.0000 | ORAL_TABLET | Freq: Four times a day (QID) | ORAL | Status: DC | PRN

## 2014-05-14 NOTE — ED Notes (Signed)
Pt amb to room 7 from restroom with urine specimen, pt reports eating at [employer] two weeks ago, since then she has had intermittent constipation, with diarrhea this morning. Pt denies any back pain, fevers, n/v or any other c/o.

## 2014-05-14 NOTE — ED Provider Notes (Signed)
CSN: [id number]     Arrival date & time 05/14/14  4098 History   First MD Initiated Contact with Patient 05/14/14 [phone]     Chief Complaint  Patient presents with  . Abdominal Pain     (Consider location/radiation/quality/duration/timing/severity/associated sxs/prior Treatment) Patient is a 28 y.o. female presenting with abdominal pain. The history is provided by the patient.  Abdominal Pain Associated symptoms: no chest pain, no dysuria, no fever, no nausea, no shortness of breath and no vomiting    patient with a 2 week history of upper abdominal pain mostly on the left side. Last evening with several episodes of diarrhea approximately 4 no blood. No nausea or vomiting. No fever. Last menstrual period was August 21. No history of similar pain. Prior to this patient felt as if she was constipated. The abdominal pain is achy in nature 5/10. Not made worse or better by anything.  Past Medical History  Diagnosis Date  . Medical history non-contributory   . Gestational diabetes    Past Surgical History  Procedure Laterality Date  . Hernia repair    . Wisdom tooth extraction    . Cesarean section N/A 07/02/2013    Procedure: CESAREAN SECTION;  Surgeon: Lavina Hamman, MD;  Location: [facility];  Service: Obstetrics;  Laterality: N/A;   Family History  Problem Relation Age of Onset  . Diabetes Maternal Uncle   . Cancer Maternal Grandmother    History  Substance Use Topics  . Smoking status: Never Smoker   . Smokeless tobacco: Never Used  . Alcohol Use: Yes     Comment: occ before pos upt but none now   OB History   Grav Para Term Preterm Abortions TAB SAB Ect Mult Living   Review of Systems  Constitutional: Negative for fever.  HENT: Negative for congestion.   Eyes: Negative for visual disturbance.  Respiratory: Negative for shortness of breath.   Cardiovascular: Negative for chest pain.  Gastrointestinal: Positive for abdominal pain. Negative for nausea and  vomiting.  Genitourinary: Negative for dysuria.  Musculoskeletal: Negative for back pain.  Skin: Negative for rash.  Neurological: Negative for headaches.  Hematological: Does not bruise/bleed easily.  Psychiatric/Behavioral: Negative for confusion.      Allergies  Review of patient's allergies indicates no known allergies.  Home Medications   Prior to Admission medications   Medication Sig Start Date End Date Taking? Authorizing Provider  HYDROcodone-acetaminophen (NORCO/VICODIN) 5-325 MG per tablet Take 1-2 tablets by mouth every 6 (six) hours as needed for moderate pain. 05/14/14   Vanetta Mulders, MD  ibuprofen (ADVIL,MOTRIN) 600 MG tablet Take 1 tablet (600 mg total) by mouth every 6 (six) hours. 07/05/13   Oliver Pila, MD  oxyCODONE-acetaminophen (PERCOCET/ROXICET) 5-325 MG per tablet Take 1-2 tablets by mouth every 4 (four) hours as needed. 07/05/13   Oliver Pila, MD  Prenatal Vit-Fe Fumarate-FA (PRENATAL MULTIVITAMIN) TABS Take 1 tablet by mouth daily at 12 noon.     Historical Provider, MD   BP 135/83  Pulse 82  Temp(Src) 99.1 F (37.3 C) (Oral)  Resp 16  Ht  (1.727 m)  Wt 188 lb (85.276 kg)  BMI 28.59 kg/m2  SpO2 99%  LMP 04/24/2014 Physical Exam  Nursing note and vitals reviewed. Constitutional: She is oriented to person, place, and time. She appears well-developed and well-nourished. No distress.  HENT:  Head: Normocephalic and atraumatic.  Mouth/Throat: Oropharynx is  clear and moist.  Eyes: Conjunctivae and EOM are normal. Pupils are equal, round, and reactive to light.  Neck: Normal range of motion.  Cardiovascular: Normal rate, regular rhythm and normal heart sounds.   Pulmonary/Chest: Breath sounds normal. No respiratory distress.  Abdominal: Soft. Bowel sounds are normal. There is no tenderness.  Musculoskeletal: Normal range of motion.  Neurological: She is alert and oriented to person, place, and time. No cranial nerve deficit.  Coordination normal.  Skin: Skin is warm. No rash noted.    ED Course  Procedures (including critical care time) Labs Review Labs Reviewed  URINALYSIS, ROUTINE W REFLEX MICROSCOPIC - Abnormal; Notable for the following:    Leukocytes, UA SMALL (*)    All other components within normal limits  URINE MICROSCOPIC-ADD ON - Abnormal; Notable for the following:    Bacteria, UA MANY (*)    Casts WBC CAST (*)    All other components within normal limits  COMPREHENSIVE METABOLIC PANEL - Abnormal; Notable for the following:    GFR calc non Af Amer 76 (*)    GFR calc Af Amer 89 (*)    All other components within normal limits  CBC WITH DIFFERENTIAL - Abnormal; Notable for the following:    RBC 5.12 (*)    All other components within normal limits  PREGNANCY, URINE  LIPASE, BLOOD   Results for orders placed during the hospital encounter of 05/14/14  URINALYSIS, ROUTINE W REFLEX MICROSCOPIC      Result Value Ref Range   Color, Urine YELLOW  YELLOW   APPearance CLEAR  CLEAR   Specific Gravity, Urine 1.028  1.005 - 1.030   pH 6.0  5.0 - 8.0   Glucose, UA NEGATIVE  NEGATIVE mg/dL   Hgb urine dipstick NEGATIVE  NEGATIVE   Bilirubin Urine NEGATIVE  NEGATIVE   Ketones, ur NEGATIVE  NEGATIVE mg/dL   Protein, ur NEGATIVE  NEGATIVE mg/dL   Urobilinogen, UA 0.2  0.0 - 1.0 mg/dL   Nitrite NEGATIVE  NEGATIVE   Leukocytes, UA SMALL (*) NEGATIVE  PREGNANCY, URINE      Result Value Ref Range   Preg Test, Ur NEGATIVE  NEGATIVE  URINE MICROSCOPIC-ADD ON      Result Value Ref Range   Squamous Epithelial / LPF RARE  RARE   WBC, UA 3-6  <3 WBC/hpf   RBC / HPF 0-2  <3 RBC/hpf   Bacteria, UA MANY (*) RARE   Casts WBC CAST (*) NEGATIVE  COMPREHENSIVE METABOLIC PANEL      Result Value Ref Range   Sodium 140  137 - 147 mEq/L   Potassium 4.1  3.7 - 5.3 mEq/L   Chloride 105  96 - 112 mEq/L   CO2 25  19 - 32 mEq/L   Glucose, Bld 93  70 - 99 mg/dL   BUN 13  6 - 23 mg/dL   Creatinine, Ser 1.61  0.50 -  1.10 mg/dL   Calcium 9.8  8.4 - 09.6 mg/dL   Total Protein 7.7  6.0 - 8.3 g/dL   Albumin 3.8  3.5 - 5.2 g/dL   AST 18  0 - 37 U/L   ALT 13  0 - 35 U/L   Alkaline Phosphatase 89  39 - 117 U/L   Total Bilirubin 0.4  0.3 - 1.2 mg/dL   GFR calc non Af Amer 76 (*) >90 mL/min   GFR calc Af Amer 89 (*) >90 mL/min   Anion gap 10  5 - 15  LIPASE, BLOOD      Result Value Ref Range   Lipase 21  11 - 59 U/L  CBC WITH DIFFERENTIAL      Result Value Ref Range   WBC 9.5  4.0 - 10.5 K/uL   RBC 5.12 (*) 3.87 - 5.11 MIL/uL   Hemoglobin 14.1  12.0 - 15.0 g/dL   HCT 16.1  09.6 - 04.5 %   MCV 81.4  78.0 - 100.0 fL   MCH 27.5  26.0 - 34.0 pg   MCHC 33.8  30.0 - 36.0 g/dL   RDW 40.9  81.1 - 91.4 %   Platelets 290  150 - 400 K/uL   Neutrophils Relative % 69  43 - 77 %   Lymphocytes Relative 22  12 - 46 %   Monocytes Relative 7  3 - 12 %   Eosinophils Relative 2  0 - 5 %   Basophils Relative 0  0 - 1 %   Neutro Abs 6.5  1.7 - 7.7 K/uL   Lymphs Abs 2.1  0.7 - 4.0 K/uL   Monocytes Absolute 0.7  0.1 - 1.0 K/uL   Eosinophils Absolute 0.2  0.0 - 0.7 K/uL   Basophils Absolute 0.0  0.0 - 0.1 K/uL   Smear Review MORPHOLOGY UNREMARKABLE       Imaging Review Ct Abdomen Pelvis W Contrast  05/14/2014   CLINICAL DATA:  Abdominal pain with constipation and diarrhea.  EXAM: CT ABDOMEN AND PELVIS WITH CONTRAST  TECHNIQUE: Multidetector CT imaging of the abdomen and pelvis was performed using the standard protocol following bolus administration of intravenous contrast.  CONTRAST:  50mL OMNIPAQUE IOHEXOL 300 MG/ML SOLN, OMNIPAQUE IOHEXOL 300 MG/ML SOLN  COMPARISON:  None.  FINDINGS: There is abnormal edema of the distal 30 cm of the ileum including the terminal ileum. Are multiple slightly enlarged mesenteric lymph nodes. The cecum and appendix appear normal. Rest of the bowel appears normal.  Liver, biliary tree, spleen, pancreas, adrenal glands, and kidneys are normal except for a 5 mm cyst on the lower pole  of the right kidney.  There is a partially collapsed cyst in the right ovary with a tiny amount of free fluid in the pelvic cul-de-sac. Uterus and left ovary are normal. Bladder is normal. No osseous abnormality. There is a faint 5 mm nodule in the right lower lobe on image 9 of series 4.  IMPRESSION: Inflammation of the distal ileum with secondary mesenteric adenopathy. This is consistent with enteritis. The possibility of Crohn's disease should be considered.  5 mm nonspecific nodule in the right lower lobe. If the patient is at high risk for bronchogenic carcinoma, follow-up chest CT at 6-12 months is recommended. If the patient is at low risk for bronchogenic carcinoma, follow-up chest CT at 12 months is recommended. This recommendation follows the consensus statement: Guidelines for Management of Small Pulmonary Nodules Detected on CT Scans: A Statement from the Fleischner Society as published in Radiology 2005;237:395-400.   Electronically Signed   By: Geanie Cooley M.D.   On: 05/14/2014 10:10     EKG Interpretation None      MDM   Final diagnoses:  Abdominal pain, unspecified abdominal location  Enteritis  Pulmonary nodule    CT scan with findings consistent with an enteritis since it's just the kind of isolated to one portion of the small bowel may be consistent with inflammatory bowel disease like Crohn's. Patient's a nonsmoker the pulmonary nodule followup CT scan in a year.  Patient does not currently have a primary care doctor so resource guide provided to help her find a primary care Dr. for additional followup so that the inflammation of the small bowel can be worked up for inflammatory bowel disease. Patient is nontoxic no acute distress.    Vanetta Mulders, MD 05/14/14 1030

## 2014-05-14 NOTE — Discharge Instructions (Signed)
Resource guide provided below. CT scan showed evidence of some inflammation in any specific part of your small intestines this can sometimes be due to inflammatory bowel disease. Followup with a regular Dr. would be important. Use resource guide to find a record Dr. In addition there is evidence of a nodule on the lungs. Since you're a nonsmoker they would recommend repeat CT of the chest in about a year. Followup for both of these would be important. Take pain medicine as directed. Return for any new or worse symptoms.   Emergency Department Resource Guide 1) Find a Doctor and Pay Out of Pocket Although you won't have to find out who is covered by your insurance plan, it is a good idea to ask around and get recommendations. You will then need to call the office and see if the doctor you have chosen will accept you as a new patient and what types of options they offer for patients who are self-pay. Some doctors offer discounts or will set up payment plans for their patients who do not have insurance, but you will need to ask so you aren't surprised when you get to your appointment.  2) Contact Your Local Health Department Not all health departments have doctors that can see patients for sick visits, but many do, so it is worth a call to see if yours does. If you don't know where your local health department is, you can check in your phone book. The CDC also has a tool to help you locate your state's health department, and many state websites also have listings of all of their local health departments.  3) Find a Walk-in Clinic If your illness is not likely to be very severe or complicated, you may want to try a walk in clinic. These are popping up all over the country in pharmacies, drugstores, and shopping centers. They're usually staffed by nurse practitioners or physician assistants that have been trained to treat common illnesses and complaints. They're usually fairly quick and inexpensive. However,  if you have serious medical issues or chronic medical problems, these are probably not your best option.  No Primary Care Doctor: - Call Health Connect at  [phone] - they can help you locate a primary care doctor that  accepts your insurance, provides certain services, etc. - Physician Referral Service- [phone]  Chronic Pain Problems: Organization         Address  Phone   Notes  [facility]  [phone] Patients need to be referred by their primary care doctor.   Medication Assistance: Organization         Address  Phone   Notes  [facility] Medication [facility] [address] [place] [place], [place] [address] [phone] --Must be a resident of [facility] -- Must have NO insurance coverage whatsoever (no Medicaid/ Medicare, etc.) -- The pt. MUST have a primary care doctor that directs their care regularly and follows them in the community   [facility]  [phone]   [employer]  [phone]    Agencies that provide inexpensive medical care: Organization         Address  Phone   Notes  [facility]  [phone]   [facility]    [phone]   [facility] [address] [place], [place] [address] [phone]   [facility] [address] [place] [place] [phone]   Planned Parenthood    (  [phone]   [facility]    [phone]   [facility]  [address], [place] Phone:  [phone], Fax:  [phone] Hours of Operation:  9 am - 6 pm, M-F.  Also accepts Medicaid/Medicare and self-pay.  [facility] for [place], [address], [place] Phone: [phone], Fax: [phone]. Hours of Operation:  8:30 am - 5:30 pm, M-F.  Also accepts Medicaid and self-pay.  [facility] [place] [address] [place] Phone: [phone]   [place], [place], [place] [phone], Ext. [phone] Mondays & Thursdays: 7-9 AM.  First 15 patients are seen on a first come, first serve basis.    [place] Providers:  Organization         Address  Phone   Notes  [facility] [address], [place] [phone] Also accepts self-pay patients.  [facility] [id number] [place], [place]  [phone]   [place], [address], [place] [phone]   [facility] [address], [place] [phone]   Lucianne Lei [address], [place]   [phone] Only accepts [place] Access [place] patients after they have their name applied to their card.   Self-Pay (no insurance) in [facility]:  Organization         Address  Phone   Notes  Sickle Cell Patients, [facility] [place] [phone]   [facility] [place] [phone]   [facility] [place]  [place], [place], [place] [phone]   [facility]/Dr. Osei-Bonsu  [address] [place] or [address], [place] [phone] Phone number for both [place] and [place] locations is the same.  [facility] [address] [place] [phone]   [facility] [address] [place] or [address] [phone]   [facility] [address] [place] [phone], phone; [phone], fax Sees patients 1st and 3rd Saturday of every month.  Must not qualify for public or private insurance (i.e. Medicaid, Medicare, [place] Health Choice, Veterans' Benefits)  Household income should be no more than 200% of the poverty level The clinic cannot treat you if you are pregnant or think you are  pregnant  Sexually transmitted diseases are not treated at the clinic.    Dental Care: Organization         Address  Phone  Notes  [facility] Department of [facility] [place] [phone] Accepts children up to age 69 who are enrolled in [place] or [place]; pregnant women with a Medicaid card; and children who have applied for Medicaid or [place] Health Choice, but were declined, whose parents can pay a reduced fee at time of service.  [facility] Department of [facility]  [address], [place] [phone] Accepts children up to age 73 who are enrolled in [place] or [place]; pregnant women with a Medicaid card; and children who have applied for Medicaid or [place], but were declined, whose parents can pay a  reduced fee at time of service.  [place] Adult [facility] Access PROGRAM  [place] [phone] Patients are seen by appointment only. Walk-ins are not accepted. [place] will see patients 24 years of age and older. Monday - Tuesday (8am-5pm) Most Wednesdays (8:30-5pm) $30 per visit, cash only  [facility] Adult Dental Access PROGRAM  [address], [facility] [phone] Patients are seen by appointment only. Walk-ins are not accepted. [place] will see patients 82 years of age and older. One Wednesday Evening (Monthly: Volunteer Based).  $30 per visit, cash only  [place]  [phone] for adults; Children under age 60, call Graduate Pediatric Dentistry at [phone]. Children aged 60-14, please call [phone] to request a pediatric application.  Dental services are provided in all areas of dental care including fillings, crowns and bridges, complete and partial dentures, implants, gum treatment, root canals, and extractions. Preventive care is also provided. Treatment is provided to both adults and  children. Patients are selected via a lottery and there is often a waiting list.   [facility] [address] [place]  [phone] [email or web address]   [facility] [address] [place], [place] [phone], Ext. [phone] Second and Fourth Thursday of each month, opens at 6:30 AM; Clinic ends at 9 AM.  Patients are seen on a first-come first-served basis, and a limited number are seen during each clinic.   [facility]  [address] Hillard Danker [place], [place] [phone]   Eligibility Requirements You must have lived in [place], [place], or [place] counties for at least the last three months.   You cannot be eligible for state or federal sponsored [employer], including [employer], [place], or [employer].   You generally cannot be eligible for healthcare insurance through your employer.    How to apply: Eligibility screenings are held every Tuesday and Wednesday afternoon from 1:00 pm until 4:00 pm. You do not need an appointment for the interview!  [facility] [address], [place], [place]   [place]  [place] Department  [place]  [phone]    Behavioral Health Resources in the Community: Intensive Outpatient Programs Organization         Address  Phone  Notes  Damon Merwin. [address] [place], [place] [phone]   [facility] [address] [place], [place]   ADS: Alcohol & Drug Svcs [address] [place], [place]   [place] [address]  [place], [place] or [phone]   Substance Abuse Resources Organization         Address  Phone  Notes  Alcohol and Drug Services  [phone]   [place]  [phone]   [facility]    Chinita Pester  [phone]   Residential & Outpatient Substance Abuse Program  [phone]   Psychological Services Organization         Address  Phone  Notes  [facility] [place]  [place]  [phone]   [place] [address] [place] or [phone]    Mobile Crisis Teams Organization         Address  Phone  Notes  [facility], Mobile Crisis Care Unit  [phone]  [facility]  [address] [employer], [place]   [facility] [address] [place] [phone]    Self-Help/Support Groups Organization         Address  Phone             Notes  [facility] of [place] - variety of support groups  Advance Call for more information  Narcotics Anonymous (NA), [facility] [address], [employer] [place]  2 meetings at this location   Special educational needs teacher         Address  Phone  Notes  [facility]AP Residential Treatment [place],    [place]  [phone]   [facility]  [address], [place] [id number], [place], [place]   [place], [place] [phone] Admissions: 8am-3pm M-F  Incentives Substance [place] [address],    [place], [place] [phone]   [facility] [address] [place], [place], [place]   [facility] [address]  [place], [place]   [facility] - Intensive Outpatient [address], [place], [place]   [facility].,  [place], [place] [phone] or [phone]   [facility] ([facility]) [address], [place], [place] Accepts Medicaid  [facility] [place] [address],  [place] [phone] Substance Abuse/Addiction Treatment   [facility] Organization         Address  Phone  Notes  [facility]  [phone]   Domenic Schwab, PhD [address] Arlis [place], [place]   [phone] or [phone]   [place], [place] [phone]   [facility] [address] [place], [place] [phone] Insurance/Medicaid/sponsorship through [facility] and Families [address] [place]                                    [place], [place] [phone] [place] [address], [place] [phone]    Dr. Adele Schilder  [phone]   [facility] Dept. 1) [address] [place] 2) [place] 3)  [place] [address], [place] [phone]  [phone]   [place] [phone] or [phone] (After Hours)

## 2014-07-06 ENCOUNTER — Encounter (HOSPITAL_BASED_OUTPATIENT_CLINIC_OR_DEPARTMENT_OTHER): Payer: Self-pay | Admitting: Emergency Medicine

## 2016-04-05 ENCOUNTER — Encounter (HOSPITAL_BASED_OUTPATIENT_CLINIC_OR_DEPARTMENT_OTHER): Payer: Self-pay | Admitting: *Deleted

## 2016-04-05 ENCOUNTER — Emergency Department (HOSPITAL_BASED_OUTPATIENT_CLINIC_OR_DEPARTMENT_OTHER): Payer: BLUE CROSS/BLUE SHIELD

## 2016-04-05 ENCOUNTER — Emergency Department (HOSPITAL_BASED_OUTPATIENT_CLINIC_OR_DEPARTMENT_OTHER)
Admission: EM | Admit: 2016-04-05 | Discharge: 2016-04-05 | Disposition: A | Discharge status: Home / Self Care | Payer: BLUE CROSS/BLUE SHIELD | Attending: Physician Assistant | Admitting: Physician Assistant

## 2016-04-05 DIAGNOSIS — Y939 Activity, unspecified: Secondary | ICD-10-CM | POA: Insufficient documentation

## 2016-04-05 DIAGNOSIS — M25572 Pain in left ankle and joints of left foot: Secondary | ICD-10-CM | POA: Insufficient documentation

## 2016-04-05 DIAGNOSIS — Y999 Unspecified external cause status: Secondary | ICD-10-CM | POA: Diagnosis not present

## 2016-04-05 DIAGNOSIS — O9A211 Injury, poisoning and certain other consequences of external causes complicating pregnancy, first trimester: Secondary | ICD-10-CM | POA: Diagnosis not present

## 2016-04-05 DIAGNOSIS — S93402A Sprain of unspecified ligament of left ankle, initial encounter: Secondary | ICD-10-CM | POA: Diagnosis not present

## 2016-04-05 DIAGNOSIS — X501XXA Overexertion from prolonged static or awkward postures, initial encounter: Secondary | ICD-10-CM | POA: Diagnosis not present

## 2016-04-05 DIAGNOSIS — S93409A Sprain of unspecified ligament of unspecified ankle, initial encounter: Secondary | ICD-10-CM

## 2016-04-05 DIAGNOSIS — Y929 Unspecified place or not applicable: Secondary | ICD-10-CM | POA: Diagnosis not present

## 2016-04-05 DIAGNOSIS — Z3A01 Less than 8 weeks gestation of pregnancy: Secondary | ICD-10-CM | POA: Insufficient documentation

## 2016-04-05 DIAGNOSIS — O26891 Other specified pregnancy related conditions, first trimester: Secondary | ICD-10-CM | POA: Diagnosis present

## 2016-04-05 NOTE — ED Triage Notes (Addendum)
C/o left ankle pain d/t stepping in a hole last pm. Slight swelling to lateral and medial side of left ankle. No other injury. Pt states she is [redacted] weeks pregnant.

## 2016-04-05 NOTE — ED Provider Notes (Signed)
[facility] Provider Note   CSN: [id number] Arrival date & time: 04/05/16  3646  First Provider Contact:  None       History   Chief Complaint Chief Complaint  Patient presents with  . Ankle Pain    HPI Toni Morgan is a 30 y.o. female.  HPI   Patient is a 30 year old female presenting with ankle pain. Patient stepped off a curb and twisted her ankle on the left. This happened last night. Patient woke up this morning with minimal swelling. She has full range motion able to ambulate and tolerate weightbearing. No scratches.  Past Medical History:  Diagnosis Date  . Gestational diabetes   . Medical history non-contributory     Patient Active Problem List   Diagnosis Date Noted  . Breech presentation without mention of version, delivered 07/02/2013    Past Surgical History:  Procedure Laterality Date  . CESAREAN SECTION N/A 07/02/2013   Procedure: CESAREAN SECTION;  Surgeon: Lavina Hamman, MD;  Location: [facility];  Service: Obstetrics;  Laterality: N/A;  . HERNIA REPAIR    . WISDOM TOOTH EXTRACTION      OB History    Gravida Para Term Preterm AB Living   2 1 1     1    SAB TAB Ectopic Multiple Live Births           1       Home Medications    Prior to Admission medications   Not on File    Family History Family History  Problem Relation Age of Onset  . Diabetes Maternal Uncle   . Cancer Maternal Grandmother     Social History Social History  Substance Use Topics  . Smoking status: Never Smoker  . Smokeless tobacco: Never Used  . Alcohol use Yes     Comment: occ before pos upt but none now     Allergies   Review of patient's allergies indicates no known allergies.   Review of Systems Review of Systems  Constitutional: Negative for activity change.  Respiratory: Negative for shortness of breath.   Cardiovascular: Negative for chest pain.  Gastrointestinal: Negative for abdominal pain.  Musculoskeletal: Positive for joint  swelling.     Physical Exam Updated Vital Signs BP 116/79 (BP Location: Left Arm)   Pulse 87   Temp 98.1 F (36.7 C) (Oral)   Resp 18   Ht 5\' 8"  (1.727 m)   Wt 200 lb (90.7 kg)   LMP 02/29/2016   SpO2 100%   BMI 30.41 kg/m   Physical Exam  Constitutional: She is oriented to person, place, and time. She appears well-developed and well-nourished.  HENT:  Head: Normocephalic and atraumatic.  Eyes: Right eye exhibits no discharge.  Cardiovascular: Normal rate.   Pulmonary/Chest: Effort normal.  Musculoskeletal:  Full range of motion no swelling mild tenderness to both malleoli. No abrasions.  Neurological: She is oriented to person, place, and time.  Skin: Skin is warm and dry. She is not diaphoretic.  Psychiatric: She has a normal mood and affect.  Nursing note and vitals reviewed.    ED Treatments / Results  Labs (all labs ordered are listed, but only abnormal results are displayed) Labs Reviewed - No data to display  EKG  EKG Interpretation None       Radiology No results found.  Procedures Procedures (including critical care time)  Medications Ordered in ED Medications - No data to display   Initial Impression / Assessment and Plan /  ED Course  I have reviewed the triage vital signs and the nursing notes.  Pertinent labs & imaging results that were available during my care of the patient were reviewed by me and considered in my medical decision making (see chart for details).  Clinical Course    Patient is a 30 year old female presenting with left ankle pain. X-ray pending. Minimal swelling no evidence of external trauma. Likely sprain, will treat with Ace bandage ice elevation.  Final Clinical Impressions(s) / ED Diagnoses   Final diagnoses:  None    New Prescriptions New Prescriptions   No medications on file     Treina Arscott Randall An, MD 04/05/16 [phone]

## 2016-11-13 DIAGNOSIS — E669 Obesity, unspecified: Secondary | ICD-10-CM | POA: Insufficient documentation

## 2019-07-31 ENCOUNTER — Encounter (HOSPITAL_BASED_OUTPATIENT_CLINIC_OR_DEPARTMENT_OTHER): Payer: Self-pay | Admitting: Emergency Medicine

## 2019-07-31 ENCOUNTER — Emergency Department (HOSPITAL_BASED_OUTPATIENT_CLINIC_OR_DEPARTMENT_OTHER)
Admission: EM | Admit: 2019-07-31 | Discharge: 2019-07-31 | Disposition: A | Discharge status: Home / Self Care | Payer: BC Managed Care – PPO | Attending: Emergency Medicine | Admitting: Emergency Medicine

## 2019-07-31 ENCOUNTER — Other Ambulatory Visit: Payer: Self-pay

## 2019-07-31 DIAGNOSIS — N12 Tubulo-interstitial nephritis, not specified as acute or chronic: Secondary | ICD-10-CM | POA: Diagnosis not present

## 2019-07-31 DIAGNOSIS — R1012 Left upper quadrant pain: Secondary | ICD-10-CM | POA: Diagnosis present

## 2019-07-31 LAB — URINALYSIS, ROUTINE W REFLEX MICROSCOPIC
Bilirubin Urine: NEGATIVE
Glucose, UA: NEGATIVE mg/dL
Ketones, ur: NEGATIVE mg/dL
Nitrite: NEGATIVE
Protein, ur: NEGATIVE mg/dL
Specific Gravity, Urine: 1.02 (ref 1.005–1.030)
pH: 6.5 (ref 5.0–8.0)

## 2019-07-31 LAB — PREGNANCY, URINE: Preg Test, Ur: NEGATIVE

## 2019-07-31 LAB — URINALYSIS, MICROSCOPIC (REFLEX): WBC, UA: >50 WBC/hpf (ref 0–5)

## 2019-07-31 MED ORDER — PHENAZOPYRIDINE HCL 200 MG PO TABS: 200.0000 mg | ORAL_TABLET | Freq: Three times a day (TID) | ORAL | 0 refills | Status: DC

## 2019-07-31 MED ORDER — CEPHALEXIN 500 MG PO CAPS: 500.0000 mg | ORAL_CAPSULE | Freq: Four times a day (QID) | ORAL | 0 refills | Status: DC

## 2019-07-31 NOTE — ED Provider Notes (Signed)
[facility] EMERGENCY DEPARTMENT Provider Note   CSN: [id number] Arrival date & time: 07/31/19  3875     History   Chief Complaint Chief Complaint  Patient presents with  . Back Pain    HPI Toni Morgan is a 33 y.o. female.  33 year old female here with 1 week of left flank pain and urinary frequency.  A little bit of low abdominal pressure after she urinates.  No fevers chills nausea vomiting diarrhea.  No trauma.  Last menstrual period 1 week ago.  No vaginal discharge or bleeding.  She said her back pain is 5 out of 10 after taking ibuprofen but was 10 out of 10.     The history is provided by the patient.  Back Pain Location:  Lumbar spine Quality:  Aching Radiates to:  Does not radiate Pain severity:  Moderate Pain is:  Same all the time Onset quality:  Gradual Duration:  1 week Timing:  Constant Progression:  Unchanged Chronicity:  New Context: not falling, not recent illness and not recent injury   Relieved by:  Nothing Worsened by:  Nothing Ineffective treatments:  Ibuprofen Associated symptoms: dysuria   Associated symptoms: no abdominal pain, no bladder incontinence, no bowel incontinence, no chest pain, no fever, no leg pain, no numbness and no weakness     Past Medical History:  Diagnosis Date  . Gestational diabetes   . Medical history non-contributory     Patient Active Problem List   Diagnosis Date Noted  . Breech presentation without mention of version, delivered 07/02/2013    Past Surgical History:  Procedure Laterality Date  . CESAREAN SECTION N/A 07/02/2013   Procedure: CESAREAN SECTION;  Surgeon: Lavina Hamman, MD;  Location: [facility];  Service: Obstetrics;  Laterality: N/A;  . HERNIA REPAIR    . WISDOM TOOTH EXTRACTION       OB History    Gravida  2   Para  1   Term  1   Preterm      AB      Living  1     SAB      TAB      Ectopic      Multiple      Live Births  1            Home Medications     Prior to Admission medications   Not on File    Family History Family History  Problem Relation Age of Onset  . Diabetes Maternal Uncle   . Cancer Maternal Grandmother     Social History Social History   Tobacco Use  . Smoking status: Never Smoker  . Smokeless tobacco: Never Used  Substance Use Topics  . Alcohol use: Yes    Comment: occ before pos upt but none now  . Drug use: No     Allergies   Patient has no known allergies.   Review of Systems Review of Systems  Constitutional: Negative for fever.  HENT: Negative for sore throat.   Eyes: Negative for visual disturbance.  Respiratory: Negative for shortness of breath.   Cardiovascular: Negative for chest pain.  Gastrointestinal: Negative for abdominal pain and bowel incontinence.  Genitourinary: Positive for dysuria and frequency. Negative for bladder incontinence and vaginal discharge.  Musculoskeletal: Positive for back pain.  Skin: Negative for rash.  Neurological: Negative for weakness and numbness.     Physical Exam Updated Vital Signs BP (!) 125/92 (BP Location: Right Arm)   Pulse 88  Temp 98.2 F (36.8 C) (Oral)   Resp 16   Ht 5\' 8"  (1.727 m)   Wt 88.9 kg   LMP 07/24/2019   SpO2 99%   Breastfeeding Unknown   BMI 29.80 kg/m   Physical Exam Vitals signs and nursing note reviewed.  Constitutional:      General: She is not in acute distress.    Appearance: She is well-developed.  HENT:     Head: Normocephalic and atraumatic.  Eyes:     Conjunctiva/sclera: Conjunctivae normal.  Neck:     Musculoskeletal: Neck supple.  Cardiovascular:     Rate and Rhythm: Normal rate and regular rhythm.     Heart sounds: No murmur.  Pulmonary:     Effort: Pulmonary effort is normal. No respiratory distress.     Breath sounds: Normal breath sounds.  Abdominal:     Palpations: Abdomen is soft.     Tenderness: There is no abdominal tenderness. There is left CVA tenderness.  Musculoskeletal: Normal range  of motion.     Right lower leg: No edema.     Left lower leg: No edema.     Comments: No midline spine tenderness.  Skin:    General: Skin is warm and dry.     Capillary Refill: Capillary refill takes less than 2 seconds.  Neurological:     General: No focal deficit present.     Mental Status: She is alert.     Gait: Gait normal.      ED Treatments / Results  Labs (all labs ordered are listed, but only abnormal results are displayed) Labs Reviewed  URINALYSIS, ROUTINE W REFLEX MICROSCOPIC - Abnormal; Notable for the following components:      Result Value   APPearance CLOUDY (*)    Hgb urine dipstick TRACE (*)    Leukocytes,Ua SMALL (*)    All other components within normal limits  URINALYSIS, MICROSCOPIC (REFLEX) - Abnormal; Notable for the following components:   Bacteria, UA MANY (*)    All other components within normal limits  PREGNANCY, URINE    EKG None  Radiology No results found.  Procedures Procedures (including critical care time)  Medications Ordered in ED Medications - No data to display   Initial Impression / Assessment and Plan / ED Course  I have reviewed the triage vital signs and the nursing notes.  Pertinent labs & imaging results that were available during my care of the patient were reviewed by me and considered in my medical decision making (see chart for details).  Clinical Course as of Jul 30 1233  Thu Jul 30, 6345  5777 33 year old with left lower back pain x1 week and urinary frequency.  Differential includes Pilo, kidney stone, musculoskeletal   [MB]    Clinical Course User Index [MB] Hayden Rasmussen, MD         Final Clinical Impressions(s) / ED Diagnoses   Final diagnoses:  Pyelonephritis    ED Discharge Orders         Ordered    cephALEXin (KEFLEX) 500 MG capsule  4 times daily,   Status:  Discontinued     07/31/19 0819    phenazopyridine (PYRIDIUM) 200 MG tablet  3 times daily,   Status:  Discontinued      07/31/19 0819    cephALEXin (KEFLEX) 500 MG capsule  4 times daily     07/31/19 0833    phenazopyridine (PYRIDIUM) 200 MG tablet  3 times daily     07/31/19  [id number]           Terrilee FilesButler, Michael C, MD 07/31/19 [phone]

## 2019-07-31 NOTE — Discharge Instructions (Addendum)
You were seen in the emergency department for left-sided back pain and urinary frequency.  Your urine is showing signs of urinary tract infection.  We are treating you with antibiotics and some medication to help with the urine spasm.  Please continue to use Tylenol and ibuprofen for pain and fever.  Return to the emergency department if any worsening symptoms.

## 2019-07-31 NOTE — ED Triage Notes (Signed)
L side back pain with urinary frequency x 1 week.

## 2019-12-17 ENCOUNTER — Ambulatory Visit: Payer: Self-pay | Attending: Family

## 2019-12-17 DIAGNOSIS — Z20822 Contact with and (suspected) exposure to covid-19: Secondary | ICD-10-CM

## 2019-12-18 LAB — NOVEL CORONAVIRUS, NAA: SARS-CoV-2, NAA: NOT DETECTED

## 2019-12-18 LAB — SARS-COV-2, NAA 2 DAY TAT

## 2020-07-22 ENCOUNTER — Ambulatory Visit: Payer: Self-pay | Attending: Internal Medicine

## 2020-07-22 DIAGNOSIS — Z23 Encounter for immunization: Secondary | ICD-10-CM

## 2020-07-22 NOTE — Progress Notes (Signed)
   Covid-19 Vaccination Clinic  Name:  Toni Morgan    MRN: [id number] DOB: 12-Sep-1985  07/22/2020  [name] was observed post Covid-19 immunization for 15 minutes without incident. She was provided with Vaccine Information Sheet and instruction to access the V-Safe system.   [name] was instructed to call 911 with any severe reactions post vaccine: Marland Kitchen Difficulty breathing  . Swelling of face and throat  . A fast heartbeat  . A bad rash all over body  . Dizziness and weakness   Immunizations Administered    Name Date Dose VIS Date Route   Pfizer COVID-19 Vaccine 07/22/2020  2:17 PM 0.3 mL 06/23/2020 Intramuscular   Manufacturer: [employer], [employer]   Lot: [id number]   NDC: [address]-9

## 2020-08-12 ENCOUNTER — Ambulatory Visit: Payer: Self-pay | Attending: Internal Medicine

## 2020-08-12 DIAGNOSIS — Z23 Encounter for immunization: Secondary | ICD-10-CM

## 2020-08-12 NOTE — Progress Notes (Signed)
   Covid-19 Vaccination Clinic  Name:  Toni Morgan    MRN: [id number] DOB: 05-24-1986  08/12/2020  [name] was observed post Covid-19 immunization for 15 minutes without incident. She was provided with Vaccine Information Sheet and instruction to access the V-Safe system.   [name] was instructed to call 911 with any severe reactions post vaccine: Marland Kitchen Difficulty breathing  . Swelling of face and throat  . A fast heartbeat  . A bad rash all over body  . Dizziness and weakness   Immunizations Administered    Name Date Dose VIS Date Route   Pfizer COVID-19 Vaccine 08/12/2020  2:53 PM 0.3 mL 06/23/2020 Intramuscular   Manufacturer: [employer], [employer]   Lot: [id number]   NDC: [address]-1

## 2020-09-04 NOTE — L&D Delivery Note (Signed)
OB/GYN Faculty Practice Delivery Note  Toni Morgan is a 35 y.o. [id number] s/p NSVD at [redacted]w[redacted]d. She was admitted for preterm labor.  ROM: 90h 72m with blood tinged fluid GBS Status: Unknown; treated with PCN/Amp/Azithro  Delivery Date/Time: 06/13/21 at 2217  Delivery: Called to room and patient was complete and pushing. Head delivered middle occiput anterior. No nuchal cord present. Shoulder and body delivered in usual fashion. Infant with spontaneous cry, placed on mother's abdomen, dried and stimulated. Cord clamped x 2 after 30 second delay, and cut by FOB under my direct supervision. Cord blood drawn. Gentle cord traction performed to facilitate delivery of placenta. Cord noted to be tearing with traction. Manually grasped edge of placenta at the level of the cervical os with subsequent delivery of intact placenta. Fundus firm with massage and Pitocin. Lower uterine sweep performed to remove small clots. Labia, perineum, vagina, and cervix were inspected with no lacerations noted.   Placenta: Intact; 3VC - sent to pathology  Complications: None Lacerations: None EBL: 225 cc Analgesia: None  Infant: Viable female  APGARs 7 and 9  Weight 2330 g  Evalina Field, MD OB/GYN Fellow, Faculty Practice

## 2020-09-10 ENCOUNTER — Other Ambulatory Visit: Payer: Self-pay

## 2020-09-10 ENCOUNTER — Inpatient Hospital Stay (HOSPITAL_BASED_OUTPATIENT_CLINIC_OR_DEPARTMENT_OTHER)
Admission: EM | Admit: 2020-09-10 | Discharge: 2020-09-10 | Disposition: A | Discharge status: Home / Self Care | Payer: Managed Care, Other (non HMO) | Attending: Obstetrics and Gynecology | Admitting: Obstetrics and Gynecology

## 2020-09-10 ENCOUNTER — Inpatient Hospital Stay (HOSPITAL_COMMUNITY): Payer: Managed Care, Other (non HMO)

## 2020-09-10 ENCOUNTER — Encounter (HOSPITAL_BASED_OUTPATIENT_CLINIC_OR_DEPARTMENT_OTHER): Payer: Self-pay | Admitting: Emergency Medicine

## 2020-09-10 DIAGNOSIS — O209 Hemorrhage in early pregnancy, unspecified: Secondary | ICD-10-CM

## 2020-09-10 DIAGNOSIS — O418X10 Other specified disorders of amniotic fluid and membranes, first trimester, not applicable or unspecified: Secondary | ICD-10-CM

## 2020-09-10 DIAGNOSIS — O208 Other hemorrhage in early pregnancy: Secondary | ICD-10-CM | POA: Diagnosis not present

## 2020-09-10 DIAGNOSIS — O468X1 Other antepartum hemorrhage, first trimester: Secondary | ICD-10-CM | POA: Diagnosis not present

## 2020-09-10 DIAGNOSIS — Z3A09 9 weeks gestation of pregnancy: Secondary | ICD-10-CM

## 2020-09-10 LAB — CBC
HCT: 40 % (ref 36.0–46.0)
Hemoglobin: 13.3 g/dL (ref 12.0–15.0)
MCH: 28.1 pg (ref 26.0–34.0)
MCHC: 33.3 g/dL (ref 30.0–36.0)
MCV: 84.4 fL (ref 80.0–100.0)
Platelets: 273 10*3/uL (ref 150–400)
RBC: 4.74 MIL/uL (ref 3.87–5.11)
RDW: 13.4 % (ref 11.5–15.5)
WBC: 8.3 10*3/uL (ref 4.0–10.5)
nRBC: 0 % (ref 0.0–0.2)

## 2020-09-10 LAB — BASIC METABOLIC PANEL
Anion gap: 10 (ref 5–15)
BUN: 13 mg/dL (ref 6–20)
CO2: 23 mmol/L (ref 22–32)
Calcium: 9 mg/dL (ref 8.9–10.3)
Chloride: 103 mmol/L (ref 98–111)
Creatinine, Ser: 1.01 mg/dL — ABNORMAL HIGH (ref 0.44–1.00)
GFR, Estimated: >60 mL/min (ref >60)
Glucose, Bld: 112 mg/dL — ABNORMAL HIGH (ref 70–99)
Potassium: 3.8 mmol/L (ref 3.5–5.1)
Sodium: 136 mmol/L (ref 135–145)

## 2020-09-10 LAB — WET PREP, GENITAL
Sperm: NONE SEEN
Trich, Wet Prep: NONE SEEN
Yeast Wet Prep HPF POC: NONE SEEN

## 2020-09-10 LAB — ABO/RH: ABO/RH(D): B POS

## 2020-09-10 LAB — HCG, QUANTITATIVE, PREGNANCY
hCG, Beta Chain, Quant, S: 3581 m[IU]/mL — ABNORMAL HIGH (ref <5)
hCG, Beta Chain, Quant, S: 3469 m[IU]/mL — ABNORMAL HIGH (ref <5)

## 2020-09-10 NOTE — ED Notes (Signed)
Informed pt to go to [facility] for further care. Gave report to RN @ [facility]. Vitals WNL, pt left ED w/o incident.

## 2020-09-10 NOTE — Discharge Instructions (Signed)
Subchorionic Hematoma ° °A subchorionic hematoma is a gathering of blood between the outer wall of the embryo (chorion) and the inner wall of the womb (uterus). °This condition can cause vaginal bleeding. If they cause little or no vaginal bleeding, early small hematomas usually shrink on their own and do not affect your baby or pregnancy. When bleeding starts later in pregnancy, or if the hematoma is larger or occurs in older pregnant women, the condition may be more serious. Larger hematomas may get bigger, which increases the chances of miscarriage. This condition also increases the risk of: °· Premature separation of the placenta from the uterus. °· Premature (preterm) labor. °· Stillbirth. °What are the causes? °The exact cause of this condition is not known. It occurs when blood is trapped between the placenta and the uterine wall because the placenta has separated from the original site of implantation. °What increases the risk? °You are more likely to develop this condition if: °· You were treated with fertility medicines. °· You conceived through in vitro fertilization (IVF). °What are the signs or symptoms? °Symptoms of this condition include: °· Vaginal spotting or bleeding. °· Contractions of the uterus. These cause abdominal pain. °Sometimes you may have no symptoms and the bleeding may only be seen when ultrasound images are taken (transvaginal ultrasound). °How is this diagnosed? °This condition is diagnosed based on a physical exam. This includes a pelvic exam. You may also have other tests, including: °· Blood tests. °· Urine tests. °· Ultrasound of the abdomen. °How is this treated? °Treatment for this condition can vary. Treatment may include: °· Watchful waiting. You will be monitored closely for any changes in bleeding. During this stage: °? The hematoma may be reabsorbed by the body. °? The hematoma may separate the fluid-filled space containing the embryo (gestational sac) from the wall of the  womb (endometrium). °· Medicines. °· Activity restriction. This may be needed until the bleeding stops. °Follow these instructions at home: °· Stay on bed rest if told to do so by your health care provider. °· Do not lift anything that is heavier than 10 lbs. (4.5 kg) or as told by your health care provider. °· Do not use any products that contain nicotine or tobacco, such as cigarettes and e-cigarettes. If you need help quitting, ask your health care provider. °· Track and write down the number of pads you use each day and how soaked (saturated) they are. °· Do not use tampons. °· Keep all follow-up visits as told by your health care provider. This is important. Your health care provider may ask you to have follow-up blood tests or ultrasound tests or both. °Contact a health care provider if: °· You have any vaginal bleeding. °· You have a fever. °Get help right away if: °· You have severe cramps in your stomach, back, abdomen, or pelvis. °· You pass large clots or tissue. Save any tissue for your health care provider to look at. °· You have more vaginal bleeding, and you faint or become lightheaded or weak. °Summary °· A subchorionic hematoma is a gathering of blood between the outer wall of the placenta and the uterus. °· This condition can cause vaginal bleeding. °· Sometimes you may have no symptoms and the bleeding may only be seen when ultrasound images are taken. °· Treatment may include watchful waiting, medicines, or activity restriction. °This information is not intended to replace advice given to you by your health care provider. Make sure you discuss any questions you   have with your health care provider. Document Revised: 08/03/2017 Document Reviewed: 10/17/2016 Elsevier Patient Education  2020 [employer]vier Inc.  [place] [facility] Providers     [facility] [employer] [facility]      Phone: [phone]  [facility] [employer] [facility] [place]   Phone: [phone]  [facility] [employer] [facility]: [phone]  [facility]       Phone: [phone]  [facility]  Phone: [phone]  [facility]    Phone: [phone]

## 2020-09-10 NOTE — ED Triage Notes (Signed)
Pt states she is [redacted] weeks pregnant and woke up this morning with vaginal bleeding   Pt states she had intercourse last night and she had a little bleeding last night and continues to have a small amt this morning  Denies passing any clots  Denies any pain or cramping

## 2020-09-10 NOTE — ED Provider Notes (Signed)
[facility] EMERGENCY DEPARTMENT Provider Note   CSN: [id number] Arrival date & time: 09/10/20  0557     History Chief Complaint  Patient presents with  . Vaginal Bleeding    Toni Morgan is a 35 y.o. female.  Patient presents to the emergency department for evaluation of vaginal bleeding in early pregnancy.  Patient believes that she is approximately [redacted] weeks pregnant.  She has not had her first prenatal visit yet, this is scheduled for next week.  Patient reports having intercourse last night and seeing a small amount of bleeding, then somewhat increased bleeding this morning.  No tissue passage, no clots.  Patient denies any pain.        Past Medical History:  Diagnosis Date  . Gestational diabetes   . Medical history non-contributory     Patient Active Problem List   Diagnosis Date Noted  . Breech presentation without mention of version, delivered 07/02/2013    Past Surgical History:  Procedure Laterality Date  . CESAREAN SECTION N/A 07/02/2013   Procedure: CESAREAN SECTION;  Surgeon: Lavina Hamman, MD;  Location: [facility];  Service: Obstetrics;  Laterality: N/A;  . HERNIA REPAIR    . WISDOM TOOTH EXTRACTION       OB History    Gravida  3   Para  1   Term  1   Preterm      AB      Living  1     SAB      IAB      Ectopic      Multiple      Live Births  1           Family History  Problem Relation Age of Onset  . Diabetes Maternal Uncle   . Cancer Maternal Grandmother     Social History   Tobacco Use  . Smoking status: Never Smoker  . Smokeless tobacco: Never Used  Vaping Use  . Vaping Use: Never used  Substance Use Topics  . Alcohol use: Yes    Comment: occ before pos upt but none now  . Drug use: No    Home Medications Prior to Admission medications   Medication Sig Start Date End Date Taking? Authorizing Provider  cephALEXin (KEFLEX) 500 MG capsule Take 1 capsule (500 mg total) by mouth 4 (four) times daily.  07/31/19   Terrilee Files, MD  phenazopyridine (PYRIDIUM) 200 MG tablet Take 1 tablet (200 mg total) by mouth 3 (three) times daily. 07/31/19   Terrilee Files, MD    Allergies    Patient has no known allergies.  Review of Systems   Review of Systems  Genitourinary: Positive for vaginal bleeding.  All other systems reviewed and are negative.   Physical Exam Updated Vital Signs BP 136/81 (BP Location: Left Arm)   Pulse 93   Temp 98.2 F (36.8 C) (Oral)   Resp 15   Ht 5\' 8"  (1.727 m)   Wt 93 kg   SpO2 98%   BMI 31.17 kg/m   Physical Exam Vitals and nursing note reviewed. Exam conducted with a chaperone present.  Constitutional:      General: She is not in acute distress.    Appearance: Normal appearance. She is well-developed and well-nourished.  HENT:     Head: Normocephalic and atraumatic.     Right Ear: Hearing normal.     Left Ear: Hearing normal.     Nose: Nose normal.  Mouth/Throat:     Mouth: Oropharynx is clear and moist and mucous membranes are normal.  Eyes:     Extraocular Movements: EOM normal.     Conjunctiva/sclera: Conjunctivae normal.     Pupils: Pupils are equal, round, and reactive to light.  Cardiovascular:     Rate and Rhythm: Regular rhythm.     Heart sounds: S1 normal and S2 normal. No murmur heard. No friction rub. No gallop.   Pulmonary:     Effort: Pulmonary effort is normal. No respiratory distress.     Breath sounds: Normal breath sounds.  Chest:     Chest wall: No tenderness.  Abdominal:     General: Bowel sounds are normal.     Palpations: Abdomen is soft. There is no hepatosplenomegaly.     Tenderness: There is no abdominal tenderness. There is no guarding or rebound. Negative signs include Murphy's sign and McBurney's sign.     Hernia: No hernia is present. There is no hernia in the left inguinal area or right inguinal area.  Genitourinary:    General: Normal vulva.     Vagina: Normal.     Cervix: No cervical motion  tenderness, discharge or cervical bleeding.     Uterus: Normal.      Adnexa: Right adnexa normal and left adnexa normal.  Musculoskeletal:        General: Normal range of motion.     Cervical back: Normal range of motion and neck supple.  Skin:    General: Skin is warm, dry and intact.     Findings: No rash.     Nails: There is no cyanosis.  Neurological:     Mental Status: She is alert and oriented to person, place, and time.     GCS: GCS eye subscore is 4. GCS verbal subscore is 5. GCS motor subscore is 6.     Cranial Nerves: No cranial nerve deficit.     Sensory: No sensory deficit.     Coordination: Coordination normal.     Deep Tendon Reflexes: Strength normal.  Psychiatric:        Mood and Affect: Mood and affect normal.        Speech: Speech normal.        Behavior: Behavior normal.        Thought Content: Thought content normal.     ED Results / Procedures / Treatments   Labs (all labs ordered are listed, but only abnormal results are displayed) Labs Reviewed  WET PREP, GENITAL  CBC  BASIC METABOLIC PANEL  HCG, QUANTITATIVE, PREGNANCY  ABO/RH  GC/CHLAMYDIA PROBE AMP ([place]) NOT AT [facility]    EKG None  Radiology No results found.  Procedures Procedures (including critical care time)  Medications Ordered in ED Medications - No data to display  ED Course  I have reviewed the triage vital signs and the nursing notes.  Pertinent labs & imaging results that were available during my care of the patient were reviewed by me and considered in my medical decision making (see chart for details).    MDM Rules/Calculators/A&P                          Patient presents to the emergency department for evaluation of vaginal bleeding in early pregnancy.  She has not had any prenatal care yet.  She thinks she is going to see a provider in [facility], but has not established care yet.  There is  therefore no confirmed IUP.  Patient had bleeding after intercourse last  night and it became more heavy this morning.  Currently, however, on pelvic exam there is no active bleeding.  Cervix is closed.  Examination is otherwise unremarkable.  Unfortunately, no ultrasound is available here all day today.  I have therefore contacted maternal admissions unit at [facility] and patient is accepted for transfer to be further evaluated including ultrasound.  Patient is stable for transfer by POV.  Final Clinical Impression(s) / ED Diagnoses Final diagnoses:  Vaginal bleeding affecting early pregnancy    Rx / DC Orders ED Discharge Orders    None       Gilda Crease, MD 09/10/20 [phone]

## 2020-09-10 NOTE — MAU Note (Signed)
Pt sent from [facility] for vag bleeding. Needing U/S. Denies any pain at this time.

## 2020-09-10 NOTE — MAU Provider Note (Signed)
First MAU provider contact at 0920   S [name] is a 35 y.o. [id number] pregnant female at [redacted]w[redacted]d by LMP who presents to MAU today for imaging after workup at [facility] for post-coital vaginal spotting and mild cramping that began last night.  O BP 126/82   Pulse 88   Temp 98 F (36.7 C)   Resp 18   Ht 5\' 8"  (1.727 m)   Wt 205 lb (93 kg)   SpO2 100%   BMI 31.17 kg/m  Physical Exam Vitals and nursing note reviewed.  Constitutional:      General: She is not in acute distress.    Appearance: Normal appearance. She is normal weight. She is not ill-appearing.  HENT:     Mouth/Throat:     Mouth: Mucous membranes are moist.  Eyes:     Pupils: Pupils are equal, round, and reactive to light.  Cardiovascular:     Rate and Rhythm: Normal rate.     Pulses: Normal pulses.  Pulmonary:     Effort: Pulmonary effort is normal.  Musculoskeletal:        General: Normal range of motion.  Skin:    General: Skin is warm and dry.     Capillary Refill: Capillary refill takes less than 2 seconds.  Neurological:     Mental Status: She is alert and oriented to person, place, and time.  Psychiatric:        Mood and Affect: Mood normal.        Behavior: Behavior normal.        Thought Content: Thought content normal.        Judgment: Judgment normal.    Results for orders placed or performed during the hospital encounter of 09/10/20 (from the past 24 hour(s))  CBC     Status: None   Collection Time: 09/10/20  6:32 AM  Result Value Ref Range   WBC 8.3 4.0 - 10.5 K/uL   RBC 4.74 3.87 - 5.11 MIL/uL   Hemoglobin 13.3 12.0 - 15.0 g/dL   HCT 11/08/20 00.8 - 67.6 %   MCV 84.4 80.0 - 100.0 fL   MCH 28.1 26.0 - 34.0 pg   MCHC 33.3 30.0 - 36.0 g/dL   RDW 19.5 09.3 - 26.7 %   Platelets 273 150 - 400 K/uL   nRBC 0.0 0.0 - 0.2 %  Basic metabolic panel     Status: Abnormal   Collection Time: 09/10/20  6:32 AM  Result Value Ref Range   Sodium 136 135 - 145 mmol/L   Potassium 3.8 3.5 - 5.1 mmol/L    Chloride 103 98 - 111 mmol/L   CO2 23 22 - 32 mmol/L   Glucose, Bld 112 (H) 70 - 99 mg/dL   BUN 13 6 - 20 mg/dL   Creatinine, Ser 11/08/20 (H) 0.44 - 1.00 mg/dL   Calcium 9.0 8.9 - 5.80 mg/dL   GFR, Estimated 99.8 >33 mL/min   Anion gap 10 5 - 15  Wet prep, genital     Status: Abnormal   Collection Time: 09/10/20  6:32 AM   Specimen: Cervical/Vaginal swab  Result Value Ref Range   Yeast Wet Prep HPF POC NONE SEEN NONE SEEN   Trich, Wet Prep NONE SEEN NONE SEEN   Clue Cells Wet Prep HPF POC PRESENT (A) NONE SEEN   WBC, Wet Prep HPF POC MANY (A) NONE SEEN   Sperm NONE SEEN    11/08/20 OB LESS THAN 14 WEEKS WITH  OB TRANSVAGINAL  Result Date: 09/10/2020 CLINICAL DATA:  Vaginal bleeding. Nine weeks and 0 days pregnant by last menstrual period. EXAM: OBSTETRIC <14 WK [place] AND TRANSVAGINAL OB US TECHNIQUE: Both transabdominal and transvaginal ultrasound examinations were performed for complete evaluation of the gestation as well as the maternal uterus, adnexal regions, and pelvic cul-de-sac. Transvaginal technique was performed to assess early pregnancy. COMPARISON:  11/17/2012. FINDINGS: Intrauterine gestational sac: Visualized Yolk sac:  Visualized Embryo:  Not visualized MSD: 9.6 mm   5 w   5 d Subchorionic hemorrhage:  Small. Maternal uterus/adnexae: Normal appearing maternal ovaries. No free peritoneal fluid. IMPRESSION: Intrauterine gestational sac containing a normal appearing yolk sac. The fetal pole is not yet visible. Recommend follow-up quantitative B-HCG levels and follow-up US in 14 days to assess viability. This recommendation follows SRU consensus guidelines: Diagnostic Criteria for Nonviable Pregnancy Early in the First Trimester. Malva Limes Med 2013; 096:2836-62. Electronically Signed   By: Beckie Salts M.D.   On: 09/10/2020 08:54   bHCG ordered, RN to draw, patient does not need to wait for result prior to discharge.  Discussed results with patient and advised pelvic rest for the next two weeks to  help [facility] bleeding.  A IUGS w/ YS  Subchorionic hemorrhage  P Discharge from MAU in stable condition with bleeding precautions Pt to establish care at one of the [facility] practices that accept Pregnancy Medicaid - list in AVS.  Bernerd Limbo, CNM 09/10/2020 9:25 AM

## 2020-09-13 LAB — GC/CHLAMYDIA PROBE AMP (~~LOC~~) NOT AT ARMC
Chlamydia: NEGATIVE
Comment: NEGATIVE
Comment: NORMAL
Neisseria Gonorrhea: NEGATIVE

## 2020-11-29 ENCOUNTER — Other Ambulatory Visit: Payer: Self-pay

## 2020-11-29 ENCOUNTER — Inpatient Hospital Stay (HOSPITAL_COMMUNITY): Payer: Managed Care, Other (non HMO)

## 2020-11-29 ENCOUNTER — Encounter (HOSPITAL_COMMUNITY): Payer: Self-pay | Admitting: Obstetrics and Gynecology

## 2020-11-29 ENCOUNTER — Inpatient Hospital Stay (HOSPITAL_COMMUNITY)
Admission: AD | Admit: 2020-11-29 | Discharge: 2020-11-29 | Disposition: A | Discharge status: Home / Self Care | Payer: Managed Care, Other (non HMO) | Attending: Obstetrics and Gynecology | Admitting: Obstetrics and Gynecology

## 2020-11-29 DIAGNOSIS — O98313 Other infections with a predominantly sexual mode of transmission complicating pregnancy, third trimester: Secondary | ICD-10-CM

## 2020-11-29 DIAGNOSIS — R7989 Other specified abnormal findings of blood chemistry: Secondary | ICD-10-CM | POA: Diagnosis not present

## 2020-11-29 DIAGNOSIS — O2341 Unspecified infection of urinary tract in pregnancy, first trimester: Secondary | ICD-10-CM | POA: Diagnosis not present

## 2020-11-29 DIAGNOSIS — N390 Urinary tract infection, site not specified: Secondary | ICD-10-CM | POA: Insufficient documentation

## 2020-11-29 DIAGNOSIS — R81 Glycosuria: Secondary | ICD-10-CM | POA: Insufficient documentation

## 2020-11-29 DIAGNOSIS — O09291 Supervision of pregnancy with other poor reproductive or obstetric history, first trimester: Secondary | ICD-10-CM | POA: Insufficient documentation

## 2020-11-29 DIAGNOSIS — A5901 Trichomonal vulvovaginitis: Secondary | ICD-10-CM | POA: Insufficient documentation

## 2020-11-29 DIAGNOSIS — Z3A01 Less than 8 weeks gestation of pregnancy: Secondary | ICD-10-CM | POA: Diagnosis not present

## 2020-11-29 DIAGNOSIS — O209 Hemorrhage in early pregnancy, unspecified: Secondary | ICD-10-CM | POA: Diagnosis present

## 2020-11-29 DIAGNOSIS — Z6790 Unspecified blood type, Rh positive: Secondary | ICD-10-CM | POA: Diagnosis not present

## 2020-11-29 DIAGNOSIS — Z8759 Personal history of other complications of pregnancy, childbirth and the puerperium: Secondary | ICD-10-CM | POA: Diagnosis not present

## 2020-11-29 DIAGNOSIS — O99891 Other specified diseases and conditions complicating pregnancy: Secondary | ICD-10-CM

## 2020-11-29 DIAGNOSIS — A599 Trichomoniasis, unspecified: Secondary | ICD-10-CM

## 2020-11-29 DIAGNOSIS — O26891 Other specified pregnancy related conditions, first trimester: Secondary | ICD-10-CM | POA: Diagnosis not present

## 2020-11-29 DIAGNOSIS — O99810 Abnormal glucose complicating pregnancy: Secondary | ICD-10-CM

## 2020-11-29 DIAGNOSIS — O360110 Maternal care for anti-D [Rh] antibodies, first trimester, not applicable or unspecified: Secondary | ICD-10-CM | POA: Diagnosis not present

## 2020-11-29 DIAGNOSIS — O4690 Antepartum hemorrhage, unspecified, unspecified trimester: Secondary | ICD-10-CM

## 2020-11-29 DIAGNOSIS — O98311 Other infections with a predominantly sexual mode of transmission complicating pregnancy, first trimester: Secondary | ICD-10-CM | POA: Insufficient documentation

## 2020-11-29 DIAGNOSIS — Z3490 Encounter for supervision of normal pregnancy, unspecified, unspecified trimester: Secondary | ICD-10-CM

## 2020-11-29 HISTORY — DX: Other specified postprocedural states: Z98.890

## 2020-11-29 LAB — COMPREHENSIVE METABOLIC PANEL
ALT: 20 U/L (ref 0–44)
AST: 20 U/L (ref 15–41)
Albumin: 3.5 g/dL (ref 3.5–5.0)
Alkaline Phosphatase: 56 U/L (ref 38–126)
Anion gap: 4 — ABNORMAL LOW (ref 5–15)
BUN: 13 mg/dL (ref 6–20)
CO2: 27 mmol/L (ref 22–32)
Calcium: 9.2 mg/dL (ref 8.9–10.3)
Chloride: 103 mmol/L (ref 98–111)
Creatinine, Ser: 1.1 mg/dL — ABNORMAL HIGH (ref 0.44–1.00)
GFR, Estimated: >60 mL/min (ref >60)
Glucose, Bld: 136 mg/dL — ABNORMAL HIGH (ref 70–99)
Potassium: 4.2 mmol/L (ref 3.5–5.1)
Sodium: 134 mmol/L — ABNORMAL LOW (ref 135–145)
Total Bilirubin: 0.5 mg/dL (ref 0.3–1.2)
Total Protein: 6.8 g/dL (ref 6.5–8.1)

## 2020-11-29 LAB — WET PREP, GENITAL
Clue Cells Wet Prep HPF POC: NONE SEEN
Sperm: NONE SEEN
Yeast Wet Prep HPF POC: NONE SEEN

## 2020-11-29 LAB — URINALYSIS, ROUTINE W REFLEX MICROSCOPIC
Bilirubin Urine: NEGATIVE
Glucose, UA: >=500 mg/dL — AB
Hgb urine dipstick: NEGATIVE
Ketones, ur: 5 mg/dL — AB
Nitrite: NEGATIVE
Protein, ur: NEGATIVE mg/dL
Specific Gravity, Urine: 1.031 — ABNORMAL HIGH (ref 1.005–1.030)
pH: 6 (ref 5.0–8.0)

## 2020-11-29 LAB — CBC
HCT: 38.7 % (ref 36.0–46.0)
Hemoglobin: 12.8 g/dL (ref 12.0–15.0)
MCH: 27.8 pg (ref 26.0–34.0)
MCHC: 33.1 g/dL (ref 30.0–36.0)
MCV: 84.1 fL (ref 80.0–100.0)
Platelets: 286 10*3/uL (ref 150–400)
RBC: 4.6 MIL/uL (ref 3.87–5.11)
RDW: 13.3 % (ref 11.5–15.5)
WBC: 12.3 10*3/uL — ABNORMAL HIGH (ref 4.0–10.5)
nRBC: 0 % (ref 0.0–0.2)

## 2020-11-29 LAB — PREGNANCY, URINE: Preg Test, Ur: POSITIVE — AB

## 2020-11-29 LAB — HCG, QUANTITATIVE, PREGNANCY: hCG, Beta Chain, Quant, S: 24278 m[IU]/mL — ABNORMAL HIGH (ref <5)

## 2020-11-29 MED ORDER — METRONIDAZOLE 500 MG PO TABS: 2000.0000 mg | ORAL_TABLET | Freq: Once | ORAL | 0 refills | Status: AC

## 2020-11-29 NOTE — Discharge Instructions (Signed)
Vaginal Bleeding During Pregnancy, First Trimester A small amount of bleeding from the vagina, or spotting, is common during early pregnancy. Some bleeding may be related to the pregnancy, and some may not. In many cases, the bleeding is normal and is not a problem. However, bleeding can also be a sign of something serious. Normal things that may cause bleeding during the first trimester:  Implantation of the fertilized egg in the lining of the uterus.  Rapid changes in blood vessels. This is caused by changes that are happening to the body during pregnancy.  Sex.  Pelvic exams. Abnormal things that may cause bleeding during the first trimester include:  Infection or inflammation of the cervix.  Growths or polyps on the cervix.  Miscarriage or threatened miscarriage.  Pregnancy that is growing outside of the uterus (ectopic pregnancy).  A fertilized egg that becomes a mass of tissue (molar pregnancy). Tell your health care provider right away if there is any bleeding from your vagina. Follow these instructions at home: Monitoring your bleeding Monitor your bleeding.  Pay attention to any changes in your symptoms. Let your health care provider know about any concerns.  Try to understand when the bleeding occurs. Does the bleeding start on its own, or does it start after something is done, such as sex or a pelvic exam?  Use a diary to record the things you see about your bleeding, including: ? The kind of bleeding you are having. Does the bleeding start and stop irregularly, or is it a constant flow? ? The severity of your bleeding. Is the bleeding heavy or light? ? The number of pads you use each day, how often you change them, and how soaked they are.  Tell your health care provider if you pass tissue. He or she may want to see it.   Activity  Follow instructions from your health care provider about limiting your activity. Ask what activities are safe for you.  Do not have  sex until your health care provider says that this is safe.  If needed, make plans for someone to help with your regular activities. General instructions  Take over-the-counter and prescription medicines only as told by your health care provider.  Do not take aspirin because it can cause bleeding.  Do not use tampons or douche.  Keep all follow-up visits. This is important. Contact a health care provider if:  You have vaginal bleeding during any part of your pregnancy.  You have cramps or labor pains.  You have a fever or chills. Get help right away if:  You have severe cramps in your back or abdomen.  You pass large clots or a large amount of tissue from your vagina.  Your bleeding increases.  You feel light-headed or weak, or you faint.  You are leaking fluid or have a gush of fluid from your vagina. Summary  A small amount of bleeding from the vagina is common during early pregnancy.  Be sure to tell your health care provider about any vaginal bleeding right away.  Try to understand when bleeding occurs. Does bleeding occur on its own, or does it occur after something is done, such as sex or pelvic exams?  Keep all follow-up visits. This is important. This information is not intended to replace advice given to you by your health care provider. Make sure you discuss any questions you have with your health care provider. Document Revised: 05/13/2020 Document Reviewed: 05/13/2020 Elsevier Patient Education  2021 [employer].  Trichomoniasis Trichomoniasis is an STI (sexually transmitted infection) that can affect both women and men. In women, the outer area of the female genitalia (vulva) and the vagina are affected. In men, mainly the penis is affected, but the prostate and other reproductive organs can also be involved.  This condition can be treated with medicine. It often has no symptoms (is asymptomatic), especially in men. If not treated,  trichomoniasis can last for months or years. What are the causes? This condition is caused by a parasite called Trichomonas vaginalis. Trichomoniasis most often spreads from person to person (is contagious) through sexual contact. What increases the risk? The following factors may make you more likely to develop this condition:  Having unprotected sex.  Having sex with a partner who has trichomoniasis.  Having multiple sexual partners.  Having had previous trichomoniasis infections or other STIs. What are the signs or symptoms? In women, symptoms of trichomoniasis include:  Abnormal vaginal discharge that is clear, white, gray, or yellow-green and foamy and has an unusual "fishy" odor.  Itching and irritation of the vagina and vulva.  Burning or pain during urination or sex.  Redness and swelling of the genitals. In men, symptoms of trichomoniasis include:  Penile discharge that may be foamy or contain pus.  Pain in the penis. This may happen only when urinating.  Itching or irritation inside the penis.  Burning after urination or ejaculation. How is this diagnosed? In women, this condition may be found during a routine Pap test or physical exam. It may be found in men during a routine physical exam. Your health care provider may do tests to help diagnose this infection, such as:  Urine tests (men and women).  The following in women: ? Testing the pH of the vagina. ? A vaginal swab test that checks for the Trichomonas vaginalis parasite. ? Testing vaginal secretions. Your health care provider may test you for other STIs, including HIV (human immunodeficiency virus). How is this treated? This condition is treated with medicine taken by mouth (orally), such as metronidazole or tinidazole, to fight the infection. Your sexual partner(s) also need to be tested and treated.  If you are a woman and you plan to become pregnant or think you may be pregnant, tell your health care  provider right away. Some medicines that are used to treat the infection should not be taken during pregnancy. Your health care provider may recommend over-the-counter medicines or creams to help relieve itching or irritation. You may be tested for infection again 3 months after treatment.   Follow these instructions at home:  Take and use over-the-counter and prescription medicines, including creams, only as told by your health care provider.  Take your antibiotic medicine as told by your health care provider. Do not stop taking the antibiotic even if you start to feel better.  Do not have sex until 7-10 days after you finish your medicine, or until your health care provider approves. Ask your health care provider when you may start to have sex again.  (Women) Do not douche or wear tampons while you have the infection.  Discuss your infection with your sexual partner(s). Make sure that your partner gets tested and treated, if necessary.  Keep all follow-up visits as told by your health care provider. This is important. How is this prevented?  Use condoms every time you have sex. Using condoms correctly and consistently can help protect against STIs.  Avoid having multiple sexual partners.  Talk with your sexual partner  about any symptoms that either of you may have, as well as any history of STIs.  Get tested for STIs and STDs (sexually transmitted diseases) before you have sex. Ask your partner to do the same.  Do not have sexual contact if you have symptoms of trichomoniasis or another STI.   Contact a health care provider if:  You still have symptoms after you finish your medicine.  You develop pain in your abdomen.  You have pain when you urinate.  You have bleeding after sex.  You develop a rash.  You feel nauseous or you vomit.  You plan to become pregnant or think you may be pregnant. Summary  Trichomoniasis is an STI (sexually transmitted infection) that can affect  both women and men.  This condition often has no symptoms (is asymptomatic), especially in men.  Without treatment, this condition can last for months or years.  You should not have sex until 7-10 days after you finish your medicine, or until your health care provider approves. Ask your health care provider when you may start to have sex again.  Discuss your infection with your sexual partner(s). Make sure that your partner gets tested and treated, if necessary. This information is not intended to replace advice given to you by your health care provider. Make sure you discuss any questions you have with your health care provider. Document Revised: 06/04/2018 Document Reviewed: 06/04/2018 Elsevier Patient Education  2021 Elsevier Inc.                        Safe Medications in Pregnancy    Acne: Benzoyl Peroxide Salicylic Acid  Backache/Headache: Tylenol: 2 regular strength every 4 hours OR              2 Extra strength every 6 hours  Colds/Coughs/Allergies: Benadryl (alcohol free) 25 mg every 6 hours as needed Breath right strips Claritin Cepacol throat lozenges Chloraseptic throat spray Cold-Eeze- up to three times per day Cough drops, alcohol free Flonase (by prescription only) Guaifenesin Mucinex Robitussin DM (plain only, alcohol free) Saline nasal spray/drops Sudafed (pseudoephedrine) & Actifed ** use only after [redacted] weeks gestation and if you do not have high blood pressure Tylenol Vicks Vaporub Zinc lozenges Zyrtec   Constipation: Colace Ducolax suppositories Fleet enema Glycerin suppositories Metamucil Milk of magnesia Miralax Senokot Smooth move tea  Diarrhea: Kaopectate Imodium A-D  *NO pepto Bismol  Hemorrhoids: Anusol Anusol HC Preparation H Tucks  Indigestion: Tums Maalox Mylanta Zantac  Pepcid  Insomnia: Benadryl (alcohol free) 25mg  every 6 hours as needed Tylenol PM Unisom, no Gelcaps  Leg  Cramps: Tums MagGel  Nausea/Vomiting:  Bonine Dramamine Emetrol Ginger extract Sea bands Meclizine  Nausea medication to take during pregnancy:  Unisom (doxylamine succinate 25 mg tablets) Take one tablet daily at bedtime. If symptoms are not adequately controlled, the dose can be increased to a maximum recommended dose of two tablets daily (1/2 tablet in the morning, 1/2 tablet mid-afternoon and one at bedtime). Vitamin B6 100mg  tablets. Take one tablet twice a day (up to 200 mg per day).  Skin Rashes: Aveeno products Benadryl cream or 25mg  every 6 hours as needed Calamine Lotion 1% cortisone cream  Yeast infection: Gyne-lotrimin 7 Monistat 7   **If taking multiple medications, please check labels to avoid duplicating the same active ingredients **take medication as directed on the label ** Do not exceed 4000 mg of tylenol in 24 hours **Do not take medications that contain aspirin or  ibuprofen

## 2020-11-29 NOTE — MAU Provider Note (Signed)
History     CSN: [id number]  Arrival date and time: 11/29/20 1510   Event Date/Time   First Provider Initiated Contact with Patient 11/29/20 1837      Chief Complaint  Patient presents with  . Vaginal Discharge  . Vaginal Bleeding   [name] is a 35 y.o. G3P1001 at [redacted]w[redacted]d who presents to MAU for vaginal bleeding which began about 3 days ago. Patient reports the spotting was intermittent and she came to make sure everything was OK. Patient reports she had a miscarriage in January and did have a negative pregnancy test and two periods after this miscarriage and was seen for miscarriage f/u. Patient also reports she is currently taking Augmentin for a UTI prescribed to her by Urgent Care, who did know she was pregnant at the time. Patient also reports some vaginal irritation at times.  Passing blood clots? no Blood soaking clothes? no Lightheaded/dizzy? no Significant pelvic pain or cramping? no Passed any tissue? no  Current pregnancy problems? Pt has not yet been seen Blood Type? B Positive Allergies? NKDA Current medications? Augmentin Current PNC & next appt? Has NOB appt with [facility] upcoming  Pt denies vaginal discharge/odor/itching. Pt denies N/V, abdominal pain, constipation, diarrhea, or urinary problems. Pt denies fever, chills, fatigue, sweating or changes in appetite. Pt denies SOB or chest pain. Pt denies dizziness, HA, light-headedness, weakness.   OB History    Gravida  3   Para  1   Term  1   Preterm      AB      Living  1     SAB      IAB      Ectopic      Multiple      Live Births  1           Past Medical History:  Diagnosis Date  . Gestational diabetes    history of with last pregnancy  . H/O colposcopy with cervical biopsy    pt reports she needs LEEP but it is on hold due to pregnancy  . Medical history non-contributory     Past Surgical History:  Procedure Laterality Date  . CESAREAN SECTION N/A 07/02/2013    Procedure: CESAREAN SECTION;  Surgeon: Lavina Hamman, MD;  Location: [facility];  Service: Obstetrics;  Laterality: N/A;  . HERNIA REPAIR    . WISDOM TOOTH EXTRACTION      Family History  Problem Relation Age of Onset  . Diabetes Maternal Uncle   . Cancer Maternal Grandmother     Social History   Tobacco Use  . Smoking status: Never Smoker  . Smokeless tobacco: Never Used  Vaping Use  . Vaping Use: Never used  Substance Use Topics  . Alcohol use: Yes    Comment: occ before pos upt but none now  . Drug use: No    Allergies: No Known Allergies  Medications Prior to Admission  Medication Sig Dispense Refill Last Dose  . amoxicillin-clavulanate (AUGMENTIN) 875-125 MG tablet Take 1 tablet by mouth daily. 1 tablet x 5 days   11/28/2020 at Unknown time  . Prenatal Vit-Fe Fumarate-FA (PRENATAL VITAMINS PO) Take 1 tablet by mouth daily.       Review of Systems  Constitutional: Negative for chills, diaphoresis, fatigue and fever.  Eyes: Negative for visual disturbance.  Respiratory: Negative for shortness of breath.   Cardiovascular: Negative for chest pain.  Gastrointestinal: Negative for abdominal pain, constipation, diarrhea, nausea and vomiting.  Genitourinary: Negative  for dysuria, flank pain, frequency, pelvic pain, urgency, vaginal bleeding and vaginal discharge.  Neurological: Negative for dizziness, weakness, light-headedness and headaches.   Physical Exam   Blood pressure 128/82, pulse 98, temperature 98.9 F (37.2 C), resp. rate 17, height 5\' 8"  (1.727 m), weight 98.7 kg, SpO2 98 %, unknown if currently breastfeeding.  Patient Vitals for the past 24 hrs:  BP Temp Pulse Resp SpO2 Height Weight  11/29/20 1624 128/82 -- 98 17 -- -- --  11/29/20 1620 -- -- -- -- 98 % -- --  11/29/20 1615 -- -- -- -- 98 % -- --  11/29/20 1610 -- -- -- -- 98 % -- --  11/29/20 1609 122/84 -- (!) 105 -- -- -- --  11/29/20 1606 122/84 98.9 F (37.2 C) (!) 109 17 98 % 5\' 8"  (1.727 m) 98.7 kg    Physical Exam Vitals and nursing note reviewed.  Constitutional:      General: She is not in acute distress.    Appearance: Normal appearance. She is normal weight. She is not ill-appearing, toxic-appearing or diaphoretic.  HENT:     Head: Normocephalic and atraumatic.  Pulmonary:     Effort: Pulmonary effort is normal.  Neurological:     Mental Status: She is alert and oriented to person, place, and time.  Psychiatric:        Mood and Affect: Mood normal.        Behavior: Behavior normal.        Thought Content: Thought content normal.        Judgment: Judgment normal.    Results for orders placed or performed during the hospital encounter of 11/29/20 (from the past 24 hour(s))  Urinalysis, Routine w reflex microscopic Urine, Clean Catch     Status: Abnormal   Collection Time: 11/29/20  4:23 PM  Result Value Ref Range   Color, Urine YELLOW YELLOW   APPearance CLEAR CLEAR   Specific Gravity, Urine 1.031 (H) 1.005 - 1.030   pH 6.0 5.0 - 8.0   Glucose, UA >=500 (A) NEGATIVE mg/dL   Hgb urine dipstick NEGATIVE NEGATIVE   Bilirubin Urine NEGATIVE NEGATIVE   Ketones, ur 5 (A) NEGATIVE mg/dL   Protein, ur NEGATIVE NEGATIVE mg/dL   Nitrite NEGATIVE NEGATIVE   Leukocytes,Ua MODERATE (A) NEGATIVE   RBC / HPF 0-5 0 - 5 RBC/hpf   WBC, UA 0-5 0 - 5 WBC/hpf   Bacteria, UA RARE (A) NONE SEEN   Squamous Epithelial / LPF 0-5 0 - 5   Mucus PRESENT   Pregnancy, urine     Status: Abnormal   Collection Time: 11/29/20  4:31 PM  Result Value Ref Range   Preg Test, Ur POSITIVE (A) NEGATIVE  Wet prep, genital     Status: Abnormal   Collection Time: 11/29/20  4:55 PM   Specimen: Vaginal  Result Value Ref Range   Yeast Wet Prep HPF POC NONE SEEN NONE SEEN   Trich, Wet Prep PRESENT (A) NONE SEEN   Clue Cells Wet Prep HPF POC NONE SEEN NONE SEEN   WBC, Wet Prep HPF POC MANY (A) NONE SEEN   Sperm NONE SEEN   CBC     Status: Abnormal   Collection Time: 11/29/20  5:14 PM  Result Value Ref  Range   WBC 12.3 (H) 4.0 - 10.5 K/uL   RBC 4.60 3.87 - 5.11 MIL/uL   Hemoglobin 12.8 12.0 - 15.0 g/dL   HCT 12/01/20 12/01/20 - 14.4 %   MCV  84.1 80.0 - 100.0 fL   MCH 27.8 26.0 - 34.0 pg   MCHC 33.1 30.0 - 36.0 g/dL   RDW 28.3 15.1 - 76.1 %   Platelets 286 150 - 400 K/uL   nRBC 0.0 0.0 - 0.2 %  Comprehensive metabolic panel     Status: Abnormal   Collection Time: 11/29/20  5:14 PM  Result Value Ref Range   Sodium 134 (L) 135 - 145 mmol/L   Potassium 4.2 3.5 - 5.1 mmol/L   Chloride 103 98 - 111 mmol/L   CO2 27 22 - 32 mmol/L   Glucose, Bld 136 (H) 70 - 99 mg/dL   BUN 13 6 - 20 mg/dL   Creatinine, Ser 6.07 (H) 0.44 - 1.00 mg/dL   Calcium 9.2 8.9 - 37.1 mg/dL   Total Protein 6.8 6.5 - 8.1 g/dL   Albumin 3.5 3.5 - 5.0 g/dL   AST 20 15 - 41 U/L   ALT 20 0 - 44 U/L   Alkaline Phosphatase 56 38 - 126 U/L   Total Bilirubin 0.5 0.3 - 1.2 mg/dL   GFR, Estimated >06 >26 mL/min   Anion gap 4 (L) 5 - 15  hCG, quantitative, pregnancy     Status: Abnormal   Collection Time: 11/29/20  5:14 PM  Result Value Ref Range   hCG, Beta Chain, Quant, S 24,278 (H) <5 mIU/mL   US OB LESS THAN 14 WEEKS WITH OB TRANSVAGINAL  Result Date: 11/29/2020 CLINICAL DATA:  Vaginal bleeding EXAM: OBSTETRIC <14 WK [place] AND TRANSVAGINAL OB US TECHNIQUE: Both transabdominal and transvaginal ultrasound examinations were performed for complete evaluation of the gestation as well as the maternal uterus, adnexal regions, and pelvic cul-de-sac. Transvaginal technique was performed to assess early pregnancy. COMPARISON:  None. FINDINGS: Intrauterine gestational sac: Single intrauterine gestational sac Yolk sac:  Visualized. Embryo:  Not Visualized. MSD: 11.7 mm   5 w   6 d Subchorionic hemorrhage:  None visualized. Maternal uterus/adnexae: Ovaries are within normal limits. Right ovary measures 2.9 x 2.5 x 2.3 cm. Left ovary measures 3.5 x 2.1 x 3.6 cm. No significant free fluid. IMPRESSION: Single intrauterine pregnancy with visible  gestational sac and yolk sac but no embryo. Consider short interval sonographic follow-up in 10-14 days to confirm viability. Otherwise negative. Electronically Signed   By: Jasmine Pang M.D.   On: 11/29/2020 18:54   MAU Course  Procedures  MDM -r/o ectopic -UA: SG 1.031/>/=500GLU/5ketones/mod leuks/rare bacteria, urine not sent for culture as patient currently being treated with antibiotics -CBC: WNL for pregnancy -CMP: Na 134/GLU136/serum cr 1.10/anion gap 4, consulted with Dr. Adrian Blackwater, nothing else to do at this time, but will send message to [facility] as patient has upcoming NOB appt scheduled with this group as new patient -[place]: single IUP, +yolk sac, no embryo, [redacted]w[redacted]d -hCG: pending at time of discharge -ABO: B POS -WetPrep: +trich, pt notified, declines EPT, wishes to have medication sent to pharmacy for treatment as the current antibiotics are making her nauseous, and patient wishes to take this treatment after her ABX are complete -GC/CT collected -pt discharged to home in stable condition  Orders Placed This Encounter  Procedures  . Wet prep, genital    Standing Status:   Standing    Number of Occurrences:   1  . US OB LESS THAN 14 WEEKS WITH OB TRANSVAGINAL    Standing Status:   Standing    Number of Occurrences:   1    Order Specific Question:  Symptom/Reason for Exam    Answer:   Vaginal bleeding in pregnancy [[id number]]  . US OB LESS THAN 14 WEEKS WITH OB TRANSVAGINAL    Standing Status:   Future    Standing Expiration Date:   11/29/2021    Order Specific Question:   Reason for Exam (SYMPTOM  OR DIAGNOSIS REQUIRED)    Answer:   viability    Order Specific Question:   Preferred Imaging Location?    Answer:   [facility] Ul[facility]ound  . Urinalysis, Routine w reflex microscopic Urine, Clean Catch    Standing Status:   Standing    Number of Occurrences:   1  . Pregnancy, urine    Standing Status:   Standing    Number of Occurrences:   1  . CBC    Standing Status:   Standing     Number of Occurrences:   1  . Comprehensive metabolic panel    Standing Status:   Standing    Number of Occurrences:   1  . hCG, quantitative, pregnancy    Standing Status:   Standing    Number of Occurrences:   1  . Discharge patient    Order Specific Question:   Discharge disposition    Answer:   01-Home or Self Care [1]    Order Specific Question:   Discharge patient date    Answer:   11/29/2020   Meds ordered this encounter  Medications  . metroNIDAZOLE (FLAGYL) 500 MG tablet    Sig: Take 4 tablets (2,000 mg total) by mouth once for 1 dose.    Dispense:  4 tablet    Refill:  0    Order Specific Question:   Supervising Provider    Answer:   Levie HeritageSTINSON, JACOB J [[id number]]   Assessment and Plan   1. Intrauterine pregnancy   2. Vaginal bleeding in pregnancy   3. [redacted] weeks gestation of pregnancy   4. Trichomonas infection   5. Blood type, Rh positive   6. Elevated serum creatinine   7. Glycosuria    Allergies as of 11/29/2020   No Known Allergies     Medication List    TAKE these medications   amoxicillin-clavulanate 875-125 MG tablet Commonly known as: AUGMENTIN Take 1 tablet by mouth daily. 1 tablet x 5 days   metroNIDAZOLE 500 MG tablet Commonly known as: Flagyl Take 4 tablets (2,000 mg total) by mouth once for 1 dose.   PRENATAL VITAMINS PO Take 1 tablet by mouth daily.       -RX metronidazole -US 10-14 days for viability -message sent to [facility] regarding kidney function and UA results -return MAU precautions given -pt discharged to home in stable condition  Joni Reiningicole E Talisa Petrak 11/29/2020, 7:27 PM

## 2020-11-29 NOTE — MAU Note (Signed)
Pt received treatment for on going UTI at Urgent Care on Saturday and began 2nd round of antibiotics. She noted vaginal irritation and itching and began monistat on Saturday as well. This am she did not have bleeding or discharge until she was at work and began to have brownish/pink spotting. Pt denies pain, cramping or contractions, no leaking of amnitotic fluid.

## 2020-11-30 LAB — GC/CHLAMYDIA PROBE AMP (~~LOC~~) NOT AT ARMC
Chlamydia: NEGATIVE
Comment: NEGATIVE
Comment: NORMAL
Neisseria Gonorrhea: NEGATIVE

## 2020-12-01 ENCOUNTER — Inpatient Hospital Stay (HOSPITAL_COMMUNITY)
Admission: AD | Admit: 2020-12-01 | Discharge: 2020-12-01 | Disposition: A | Discharge status: Home / Self Care | Payer: Managed Care, Other (non HMO) | Attending: Obstetrics and Gynecology | Admitting: Obstetrics and Gynecology

## 2020-12-01 ENCOUNTER — Other Ambulatory Visit: Payer: Self-pay

## 2020-12-01 ENCOUNTER — Encounter (HOSPITAL_COMMUNITY): Payer: Self-pay | Admitting: Obstetrics and Gynecology

## 2020-12-01 ENCOUNTER — Inpatient Hospital Stay (HOSPITAL_COMMUNITY): Payer: Managed Care, Other (non HMO)

## 2020-12-01 DIAGNOSIS — Z3A01 Less than 8 weeks gestation of pregnancy: Secondary | ICD-10-CM

## 2020-12-01 DIAGNOSIS — O4690 Antepartum hemorrhage, unspecified, unspecified trimester: Secondary | ICD-10-CM

## 2020-12-01 DIAGNOSIS — O208 Other hemorrhage in early pregnancy: Secondary | ICD-10-CM | POA: Insufficient documentation

## 2020-12-01 DIAGNOSIS — Z8759 Personal history of other complications of pregnancy, childbirth and the puerperium: Secondary | ICD-10-CM | POA: Insufficient documentation

## 2020-12-01 DIAGNOSIS — O99611 Diseases of the digestive system complicating pregnancy, first trimester: Secondary | ICD-10-CM | POA: Insufficient documentation

## 2020-12-01 DIAGNOSIS — K5900 Constipation, unspecified: Secondary | ICD-10-CM | POA: Diagnosis not present

## 2020-12-01 DIAGNOSIS — O468X1 Other antepartum hemorrhage, first trimester: Secondary | ICD-10-CM | POA: Diagnosis not present

## 2020-12-01 DIAGNOSIS — O418X10 Other specified disorders of amniotic fluid and membranes, first trimester, not applicable or unspecified: Secondary | ICD-10-CM

## 2020-12-01 DIAGNOSIS — O209 Hemorrhage in early pregnancy, unspecified: Secondary | ICD-10-CM | POA: Diagnosis present

## 2020-12-01 NOTE — MAU Provider Note (Addendum)
History     CSN: [id number]  Arrival date and time: 12/01/20 1719   Event Date/Time   First Provider Initiated Contact with Patient 12/01/20 1825      Chief Complaint  Patient presents with  . Blood Clot   HPI   Ms.Toni Morgan is a 35 y.o. female G3P1001 @ [redacted]w[redacted]d here in MAU with complaints of vaginal bleeding. She reports being seen in MAU 2 days ago for spotting and everything on [place] looked fine. She reports at 4:30 today she was using the bathroom and noticed a small clot in the toilet. No pain at this time. Reports having mild constipation and straining to have a BM during the time that she passed a clot. She has very small amount of spotting currently.   OB History    Gravida  3   Para  1   Term  1   Preterm      AB      Living  1     SAB      IAB      Ectopic      Multiple      Live Births  1           Past Medical History:  Diagnosis Date  . Gestational diabetes    history of with last pregnancy  . H/O colposcopy with cervical biopsy    pt reports she needs LEEP but it is on hold due to pregnancy  . Medical history non-contributory     Past Surgical History:  Procedure Laterality Date  . CESAREAN SECTION N/A 07/02/2013   Procedure: CESAREAN SECTION;  Surgeon: Lavina Hamman, MD;  Location: [facility];  Service: Obstetrics;  Laterality: N/A;  . HERNIA REPAIR    . WISDOM TOOTH EXTRACTION      Family History  Problem Relation Age of Onset  . Diabetes Maternal Uncle   . Cancer Maternal Grandmother   . Hypertension Mother     Social History   Tobacco Use  . Smoking status: Never Smoker  . Smokeless tobacco: Never Used  Vaping Use  . Vaping Use: Never used  Substance Use Topics  . Alcohol use: Yes    Comment: occ before pos upt but none now  . Drug use: No    Allergies: No Known Allergies  Medications Prior to Admission  Medication Sig Dispense Refill Last Dose  . amoxicillin-clavulanate (AUGMENTIN) 875-125 MG tablet Take 1 tablet  by mouth daily. 1 tablet x 5 days   12/01/2020 at 1100  . Prenatal Vit-Fe Fumarate-FA (PRENATAL VITAMINS PO) Take 1 tablet by mouth daily.   12/01/2020 at 0500   No results found for this or any previous visit (from the past 48 hour(s)).   US OB Transvaginal  Result Date: 12/01/2020 CLINICAL DATA:  Pregnant patient in first-trimester pregnancy with vaginal bleeding, past 1 clot today. EXAM: TRANSVAGINAL OB ULTRASOUND TECHNIQUE: Transvaginal ultrasound was performed for complete evaluation of the gestation as well as the maternal uterus, adnexal regions, and pelvic cul-de-sac. COMPARISON:  Obstetric ultrasound 2 days ago 11/29/2020 demonstrating intrauterine gestational sac and yolk sac but no embryo FINDINGS: Intrauterine gestational sac: Single Yolk sac:  Visualized. Embryo:  Visualized. Cardiac Activity: Visualized. Heart Rate: 103 bpm CRL:   2.1 mm   5 w 5 d                  [place] EDC: 07/29/2021 Subchorionic hemorrhage:  Small inferior to the gestational sac. Maternal uterus/adnexae: Both ovaries  are visualized and are normal. No pelvic free fluid or adnexal mass. IMPRESSION: 1. Single live intrauterine gestation. The intrauterine gestational sac contains a yolk sac, and fetal pole is now visualized. Fetal heart rate is 103 beats per minute, bradycardia likely due to early gestational age. Ultrasound [facility] 07/29/2021. 2. Small subchorionic hemorrhage. Electronically Signed   By: Narda Rutherford M.D.   On: 12/01/2020 19:03   Review of Systems  Constitutional: Negative for fever.  Gastrointestinal: Negative for abdominal pain.  Genitourinary: Positive for vaginal bleeding.  Neurological: Negative for dizziness.   Physical Exam   Blood pressure 121/84, pulse 95, temperature 98.1 F (36.7 C), temperature source Oral, resp. rate 18, height 5\' 8"  (1.727 m), weight 98.4 kg, SpO2 99 %, unknown if currently breastfeeding.  Physical Exam Constitutional:      General: She is not in acute distress.     Appearance: Normal appearance. She is not ill-appearing, toxic-appearing or diaphoretic.  HENT:     Head: Normocephalic.  Eyes:     Pupils: Pupils are equal, round, and reactive to light.  Genitourinary:    Comments: Bimanual exam: Cervix closed, posterior. Small amount of dark blood noted on exam glove. Uterus enlarged Chaperone present for exam.  Musculoskeletal:        General: Normal range of motion.  Skin:    General: Skin is warm.  Neurological:     Mental Status: She is alert and oriented to person, place, and time.  Psychiatric:        Behavior: Behavior normal.    MAU Course  Procedures  None  MDM  B positive blood type shows fetus with fetal heart rate and subchorionic hemorrhage. Reviewed this in detail with the patient. Blood minimal on exam.   Assessment and Plan   A:  1. Subchorionic hematoma in first trimester, single or unspecified fetus   2. Vaginal bleeding during pregnancy   3. [redacted] weeks gestation of pregnancy     P:  Discharge home in stable condition Keep our appointment with [facility] next week Pelvic rest Return to MAU if symptoms worsen Bleeding precautions   Rasch, [place], NP 12/01/2020 8:26 PM

## 2020-12-01 NOTE — MAU Note (Signed)
Presents with c/o passing one blood clot today @ approx 1700, currently spotting.  Denies abdominal pain/cramping.

## 2020-12-01 NOTE — Discharge Instructions (Signed)
Subchorionic Hematoma  A hematoma is a collection of blood outside of the blood vessels. A subchorionic hematoma is a collection of blood between the outer wall of the embryo (chorion) and the inner wall of the uterus. This condition can cause vaginal bleeding. Early small hematomas usually shrink on their own and do not affect your baby or pregnancy. When bleeding starts later in pregnancy, or if the hematoma is larger or occurs in older pregnant women, the condition may be more serious. Larger hematomas increase the chances of miscarriage. This condition also increases the risk of:  Premature separation of the placenta from the uterus.  Premature (preterm) labor.  Stillbirth. What are the causes? The exact cause of this condition is not known. It occurs when blood is trapped between the placenta and the uterine wall because the placenta has separated from the original site of implantation. What increases the risk? You are more likely to develop this condition if:  You were treated with fertility medicines.  You became pregnant through in vitro fertilization (IVF). What are the signs or symptoms? Symptoms of this condition include:  Vaginal spotting or bleeding.  Abdominal pain. This is rare. Sometimes you may have no symptoms and the bleeding may only be seen when ultrasound images are taken (transvaginal ultrasound). How is this diagnosed? This condition is diagnosed based on a physical exam. This includes a pelvic exam. You may also have other tests, including:  Blood tests.  Urine tests.  Ultrasound of the abdomen. How is this treated? Treatment for this condition can vary. Treatment may include:  Watchful waiting. You will be monitored closely for any changes in bleeding.  Medicines.  Activity restriction. This may be needed until the bleeding stops.  A medicine called Rh immunoglobulin. This is given if you have an Rh-negative blood type. It prevents Rh  sensitization. Follow these instructions at home:  Stay on bed rest if told to do so by your health care provider.  Do not lift anything that is heavier than 10 lb (4.5 kg), or the limit that you are told by your health care provider.  Track and write down the number of pads you use each day and how soaked (saturated) they are.  Do not use tampons.  Keep all follow-up visits. This is important. Your health care provider may ask you to have follow-up blood tests or ultrasound tests or both. Contact a health care provider if:  You have any vaginal bleeding.  You have a fever. Get help right away if:  You have severe cramps in your stomach, back, abdomen, or pelvis.  You pass large clots or tissue. Save any tissue for your health care provider to look at.  You faint.  You become light-headed or weak. Summary  A subchorionic hematoma is a collection of blood between the outer wall of the embryo (chorion) and the inner wall of the uterus.  This condition can cause vaginal bleeding.  Sometimes you may have no symptoms and the bleeding may only be seen when ultrasound images are taken.  Treatment may include watchful waiting, medicines, or activity restriction.  Keep all follow-up visits. Get help right away if you have severe cramps or heavy vaginal bleeding. This information is not intended to replace advice given to you by your health care provider. Make sure you discuss any questions you have with your health care provider. Document Revised: 05/17/2020 Document Reviewed: 05/17/2020 Elsevier Patient Education  2021 Elsevier Inc.   

## 2020-12-09 LAB — OB RESULTS CONSOLE RUBELLA ANTIBODY, IGM: Rubella: IMMUNE

## 2020-12-09 LAB — OB RESULTS CONSOLE HEPATITIS B SURFACE ANTIGEN: Hepatitis B Surface Ag: NEGATIVE

## 2020-12-12 ENCOUNTER — Other Ambulatory Visit: Payer: Self-pay

## 2020-12-12 ENCOUNTER — Encounter (HOSPITAL_COMMUNITY): Payer: Self-pay | Admitting: Obstetrics and Gynecology

## 2020-12-12 ENCOUNTER — Inpatient Hospital Stay (HOSPITAL_COMMUNITY)
Admission: AD | Admit: 2020-12-12 | Discharge: 2020-12-12 | Disposition: A | Discharge status: Home / Self Care | Payer: Managed Care, Other (non HMO) | Attending: Obstetrics and Gynecology | Admitting: Obstetrics and Gynecology

## 2020-12-12 ENCOUNTER — Inpatient Hospital Stay (HOSPITAL_COMMUNITY): Payer: Managed Care, Other (non HMO)

## 2020-12-12 DIAGNOSIS — Z3A17 17 weeks gestation of pregnancy: Secondary | ICD-10-CM

## 2020-12-12 DIAGNOSIS — O4692 Antepartum hemorrhage, unspecified, second trimester: Secondary | ICD-10-CM

## 2020-12-12 DIAGNOSIS — O418X10 Other specified disorders of amniotic fluid and membranes, first trimester, not applicable or unspecified: Secondary | ICD-10-CM

## 2020-12-12 DIAGNOSIS — O209 Hemorrhage in early pregnancy, unspecified: Secondary | ICD-10-CM

## 2020-12-12 DIAGNOSIS — O208 Other hemorrhage in early pregnancy: Secondary | ICD-10-CM | POA: Insufficient documentation

## 2020-12-12 DIAGNOSIS — Z3A01 Less than 8 weeks gestation of pregnancy: Secondary | ICD-10-CM | POA: Diagnosis not present

## 2020-12-12 DIAGNOSIS — O468X1 Other antepartum hemorrhage, first trimester: Secondary | ICD-10-CM

## 2020-12-12 NOTE — Discharge Instructions (Signed)
Subchorionic Hematoma  A hematoma is a collection of blood outside of the blood vessels. A subchorionic hematoma is a collection of blood between the outer wall of the embryo (chorion) and the inner wall of the uterus. This condition can cause vaginal bleeding. Early small hematomas usually shrink on their own and do not affect your baby or pregnancy. When bleeding starts later in pregnancy, or if the hematoma is larger or occurs in older pregnant women, the condition may be more serious. Larger hematomas increase the chances of miscarriage. This condition also increases the risk of:  Premature separation of the placenta from the uterus.  Premature (preterm) labor.  Stillbirth. What are the causes? The exact cause of this condition is not known. It occurs when blood is trapped between the placenta and the uterine wall because the placenta has separated from the original site of implantation. What increases the risk? You are more likely to develop this condition if:  You were treated with fertility medicines.  You became pregnant through in vitro fertilization (IVF). What are the signs or symptoms? Symptoms of this condition include:  Vaginal spotting or bleeding.  Abdominal pain. This is rare. Sometimes you may have no symptoms and the bleeding may only be seen when ultrasound images are taken (transvaginal ultrasound). How is this diagnosed? This condition is diagnosed based on a physical exam. This includes a pelvic exam. You may also have other tests, including:  Blood tests.  Urine tests.  Ultrasound of the abdomen. How is this treated? Treatment for this condition can vary. Treatment may include:  Watchful waiting. You will be monitored closely for any changes in bleeding.  Medicines.  Activity restriction. This may be needed until the bleeding stops.  A medicine called Rh immunoglobulin. This is given if you have an Rh-negative blood type. It prevents Rh  sensitization. Follow these instructions at home:  Stay on bed rest if told to do so by your health care provider.  Do not lift anything that is heavier than 10 lb (4.5 kg), or the limit that you are told by your health care provider.  Track and write down the number of pads you use each day and how soaked (saturated) they are.  Do not use tampons.  Keep all follow-up visits. This is important. Your health care provider may ask you to have follow-up blood tests or ultrasound tests or both. Contact a health care provider if:  You have any vaginal bleeding.  You have a fever. Get help right away if:  You have severe cramps in your stomach, back, abdomen, or pelvis.  You pass large clots or tissue. Save any tissue for your health care provider to look at.  You faint.  You become light-headed or weak. Summary  A subchorionic hematoma is a collection of blood between the outer wall of the embryo (chorion) and the inner wall of the uterus.  This condition can cause vaginal bleeding.  Sometimes you may have no symptoms and the bleeding may only be seen when ultrasound images are taken.  Treatment may include watchful waiting, medicines, or activity restriction.  Keep all follow-up visits. Get help right away if you have severe cramps or heavy vaginal bleeding. This information is not intended to replace advice given to you by your health care provider. Make sure you discuss any questions you have with your health care provider. Document Revised: 05/17/2020 Document Reviewed: 05/17/2020 Elsevier Patient Education  2021 Elsevier Inc.   

## 2020-12-12 NOTE — MAU Note (Signed)
Pt presents to MAU with c/o vaginal bleeding that started about an hour ago.  Last intercourse was about a month ago.  Pt reports that she was told that she had a subchronic hemorrhage that has resolved but then she started bleeding today with a small clot.

## 2020-12-12 NOTE — MAU Provider Note (Addendum)
History    Event Date/Time   First Provider Initiated Contact with Patient 12/12/20 2118      Chief Complaint:  Vaginal Bleeding   Toni Morgan is  35 y.o. G3P1011.  She is [redacted]w[redacted]d weeks gestation  by early ultrasound.  IUP previously confirmed. [facility] noted. Patient is here for increased vaginal bleeding and passing a small clot. Denies abd pain or passage of tissue. Goes to [facility]  ROS Abdominal Pain: Denies Vaginal bleeding: spotting.   Passage of clots or tissue: Small clot Dizziness: Denies  B POS  Physical Exam   Patient Vitals for the past 24 hrs:  BP Temp src Pulse Resp SpO2 Height Weight  12/12/20 2101 121/80 Oral 89 18 100 % 5\' 8"  (1.727 m) 96.3 kg   Constitutional: Well-nourished female in no apparent distress. No pallor Neuro: Alert and oriented 4 Cardiovascular: Normal rate Respiratory: Normal effort and rate Abdomen: Soft, nontender Gynecological Exam: deferred due to recent exam.   Labs: NA  Ultrasound Studies:     MAU course/MDM: Transvaginal ordered.  US shows live IUP and small SCH. Scant bleeding. Rh +.   Assessment 1. Subchorionic hemorrhage of placenta in first trimester, single or unspecified fetus   2. Vaginal bleeding in pregnancy, first trimester    Plan: D/C home in stable condition Bleeding precautions and pelvic rest  Follow-up Information    Ob/Gyn, [facility] Follow up.   Why: As scheduled or sooner as needed if symptoms worsen  Contact information: [address] [place] [phone]        [facility] Follow up.   Specialty: Obstetrics and Gynecology Why: as needed in pregnancy emergencies Contact information: [address] [id number] [place] [phone]             Allergies as of 12/12/2020   No Known Allergies     Medication List    TAKE these medications   PRENATAL VITAMINS PO Take 1 tablet by mouth  daily.   promethazine 25 MG tablet Commonly known as: PHENERGAN Take 25 mg by mouth every 6 (six) hours as needed for nausea or vomiting.        02/11/2021, Toni Morgan, CNM 12/12/2020, 9:52 PM

## 2020-12-13 ENCOUNTER — Ambulatory Visit: Admission: RE | Admit: 2020-12-13 | Payer: Managed Care, Other (non HMO) | Source: Ambulatory Visit

## 2021-01-07 DIAGNOSIS — R87619 Unspecified abnormal cytological findings in specimens from cervix uteri: Secondary | ICD-10-CM | POA: Insufficient documentation

## 2021-02-01 ENCOUNTER — Encounter: Payer: Self-pay | Admitting: Dietician

## 2021-02-01 ENCOUNTER — Encounter: Payer: Managed Care, Other (non HMO) | Attending: Obstetrics and Gynecology | Admitting: Dietician

## 2021-02-01 ENCOUNTER — Other Ambulatory Visit: Payer: Self-pay

## 2021-02-01 VITALS — Ht 68.0 in | Wt 215.3 lb

## 2021-02-01 DIAGNOSIS — E119 Type 2 diabetes mellitus without complications: Secondary | ICD-10-CM | POA: Diagnosis present

## 2021-02-01 NOTE — Patient Instructions (Addendum)
Get in 15-30 minutes of physical activity each day!  Work towards eating three meals and three snacks a day, about 3 hours apart!  Begin to recognize carbohydrates in your food choices!  Have 3 carb choices at each meal (45 g), and 1 carb choice per snack (15g)  Aim for ~20g of protein per meal and ~7g of protein per snack.  Choose whole grains, and high fiber foods like beans, fruits, and vegetables.

## 2021-02-01 NOTE — Progress Notes (Signed)
Diabetes Self-Management Education  Visit Type: First/Initial  Appt. Start Time: 1530 Appt. End Time: 1630  02/01/2021  [name], identified by name and date of birth, is a 35 y.o. female with a diagnosis of Diabetes: Type 2.   ASSESSMENT  Height 5\' 8"  (1.727 m), weight 215 lb 4.8 oz (97.7 kg), unknown if currently breastfeeding. Body mass index is 32.74 kg/m.   Pt is currently [redacted] weeks pregnant with their second child, pt developed gestational diabetes with their first child 7 years ago. Pt reports getting diabetes education after developing GDM with their first child.  Pt was diagnosed prediabetic at the beginning of 2022. Pt reports having a little difficulty over having to moderate their food intake while being pregnant.  Pt states they love carbs. Pt reports cutting out sodas. Pt reports very little physical activity currently. Pt is very motivated to get BG under control to avoid needing medication during pregnancy.  Pt is checking BG 4 times a day, fasting and 2 hours after each meal. FBG: 95 - 102 After Breakfast: ~140 After Lunch: ~ 165-180 After Dinner: ~ 165-180    Diabetes Self-Management Education - 02/01/21 1537      Visit Information   Visit Type First/Initial      Initial Visit   Diabetes Type Type 2    Are you currently following a meal plan? No    Are you taking your medications as prescribed? Not on Medications    Date Diagnosed May 2022      Health Coping   How would you rate your overall health? Good      Psychosocial Assessment   Patient Belief/Attitude about Diabetes Afraid    Self-management support Doctor's office;Family    Other persons present Patient    Patient Concerns Glycemic Control;Nutrition/Meal planning    Special Needs None    Preferred Learning Style No preference indicated    Learning Readiness Ready    How often do you need to have someone help you when you read instructions, pamphlets, or other written materials from  your doctor or pharmacy? 1 - Never    What is the last grade level you completed in school? Some college      Pre-Education Assessment   Patient understands the diabetes disease and treatment process. Needs Review    Patient understands incorporating nutritional management into lifestyle. Needs Review    Patient undertands incorporating physical activity into lifestyle. Needs Review    Patient understands using medications safely. Needs Review    Patient understands monitoring blood glucose, interpreting and using results Needs Review    Patient understands prevention, detection, and treatment of acute complications. Needs Review    Patient understands prevention, detection, and treatment of chronic complications. Needs Review    Patient understands how to develop strategies to address psychosocial issues. Needs Review    Patient understands how to develop strategies to promote health/change behavior. Needs Review      Complications   Last HgB A1C per patient/outside source 6.8 %   12/09/2020   How often do you check your blood sugar? 3-4 times/day    Fasting Blood glucose range (mg/dL) 02/08/2021    Postprandial Blood glucose range (mg/dL) 42-876    Have you had a dilated eye exam in the past 12 months? No    Have you had a dental exam in the past 12 months? No    Are you checking your feet? No      Dietary Intake  Breakfast Bowl of Honey Oh's with banana, reduced fat lactose free milk    Snack (morning) Carrots with ranch, 1 pear    Lunch None    Snack (afternoon) Bagel with strawberry cream cheese    Dinner Fried chicken, a little white rice, peas    Snack (evening) Vanilla ice cream    Beverage(s) water      Exercise   Exercise Type ADL's    How many days per week to you exercise? 0    How many minutes per day do you exercise? 0    Total minutes per week of exercise 0      Patient Education   Previous Diabetes Education Yes (please comment)   Gestational Diabetes, 2014    Disease state  Factors that contribute to the development of diabetes    Nutrition management  Carbohydrate counting;Reviewed blood glucose goals for pre and post meals and how to evaluate the patients' food intake on their blood glucose level.    Physical activity and exercise  Role of exercise on diabetes management, blood pressure control and cardiac health.;Helped patient identify appropriate exercises in relation to his/her diabetes, diabetes complications and other health issue.    Monitoring Purpose and frequency of SMBG.;Interpreting lab values - A1C, lipid, urine microalbumina.;Taught/discussed recording of test results and interpretation of SMBG.    Acute complications Taught treatment of hypoglycemia - the 15 rule.    Chronic complications Relationship between chronic complications and blood glucose control    Psychosocial adjustment Role of stress on diabetes;Worked with patient to identify barriers to care and solutions    Preconception care Pregnancy and GDM  Role of pre-pregnancy blood glucose control on the development of the fetus;Reviewed with patient blood glucose goals with pregnancy      Individualized Goals (developed by patient)   Nutrition Follow meal plan discussed    Physical Activity Exercise 5-7 days per week    Medications Not Applicable    Monitoring  test my blood glucose as discussed;send in my blood glucose log as discussed      Post-Education Assessment   Patient understands the diabetes disease and treatment process. Demonstrates understanding / competency    Patient understands incorporating nutritional management into lifestyle. Demonstrates understanding / competency    Patient undertands incorporating physical activity into lifestyle. Demonstrates understanding / competency    Patient understands monitoring blood glucose, interpreting and using results Demonstrates understanding / competency    Patient understands prevention, detection, and treatment of  acute complications. Needs Review    Patient understands prevention, detection, and treatment of chronic complications. Needs Review    Patient understands how to develop strategies to address psychosocial issues. Needs Review    Patient understands how to develop strategies to promote health/change behavior. Needs Review      Outcomes   Expected Outcomes Demonstrated interest in learning. Expect positive outcomes    Future DMSE 4-6 wks    Program Status Not Completed           Individualized Plan for Diabetes Self-Management Training:   Learning Objective:  Patient will have a greater understanding of diabetes self-management. Patient education plan is to attend individual and/or group sessions per assessed needs and concerns.   Plan:   Patient Instructions  Get in 15-30 minutes of physical activity each day!  Work towards eating three meals and three snacks a day, about 3 hours apart!  Begin to recognize carbohydrates in your food choices!  Have 3 carb choices at each  meal (45 g), and 1 carb choice per snack (15g)  Aim for ~20g of protein per meal and ~7g of protein per snack.  Choose whole grains, and high fiber foods like beans, fruits, and vegetables.    Expected Outcomes:  Demonstrated interest in learning. Expect positive outcomes  Education material provided: Meal plan card and Snack sheet  If problems or questions, patient to contact team via:  Phone and Email  Future DSME appointment: 4-6 wks

## 2021-02-04 ENCOUNTER — Other Ambulatory Visit: Payer: Self-pay

## 2021-02-04 ENCOUNTER — Other Ambulatory Visit: Payer: Self-pay | Admitting: Obstetrics & Gynecology

## 2021-02-04 DIAGNOSIS — Z3A15 15 weeks gestation of pregnancy: Secondary | ICD-10-CM

## 2021-02-04 DIAGNOSIS — O24419 Gestational diabetes mellitus in pregnancy, unspecified control: Secondary | ICD-10-CM

## 2021-02-14 ENCOUNTER — Telehealth (INDEPENDENT_AMBULATORY_CARE_PROVIDER_SITE_OTHER): Payer: Managed Care, Other (non HMO) | Admitting: *Deleted

## 2021-02-14 ENCOUNTER — Other Ambulatory Visit: Payer: Self-pay

## 2021-02-14 ENCOUNTER — Encounter: Payer: Self-pay | Admitting: Family Medicine

## 2021-02-14 DIAGNOSIS — O0990 Supervision of high risk pregnancy, unspecified, unspecified trimester: Secondary | ICD-10-CM

## 2021-02-14 DIAGNOSIS — Z3A00 Weeks of gestation of pregnancy not specified: Secondary | ICD-10-CM

## 2021-02-14 DIAGNOSIS — R87610 Atypical squamous cells of undetermined significance on cytologic smear of cervix (ASC-US): Secondary | ICD-10-CM

## 2021-02-14 DIAGNOSIS — O24419 Gestational diabetes mellitus in pregnancy, unspecified control: Secondary | ICD-10-CM

## 2021-02-14 DIAGNOSIS — E669 Obesity, unspecified: Secondary | ICD-10-CM

## 2021-02-14 DIAGNOSIS — E119 Type 2 diabetes mellitus without complications: Secondary | ICD-10-CM

## 2021-02-14 HISTORY — DX: Type 2 diabetes mellitus without complications: E11.9

## 2021-02-14 NOTE — Progress Notes (Signed)
1:08 Salimatou not connected virtually. Per chart review may be going to [facility] now.  I called patient and left a message that I was calling for her virtual appointment and to join me if she is planning to keep the appointment; otherwise to let us know if she she is going elsewhere by calling us. Yazleemar Strassner,RN  New OB Intake  I connected with  Lillette Boxer on 02/14/21 at  1:15 PM EDT by MyChart Video Visit and verified that I am speaking with the correct person using two identifiers. Nurse is located at [facility] and pt is located at work.  I discussed the limitations, risks, security and privacy concerns of performing an evaluation and management service by telephone and the availability of in person appointments. I also discussed with the patient that there may be a patient responsible charge related to this service. The patient expressed understanding and agreed to proceed.  I explained I am completing New OB Intake today. She explained she had started with [facility] but they said they don't accept Medicaid as her secondary and she would have to pay more. She is transferring her care to [place] and will call them to transfer records or sign a release if needed. We discussed her EDD of 07/27/21 that is based on LMP of 10/20/20. Pt is G4/P1021. I reviewed her allergies, medications, Medical/Surgical/OB history, and appropriate screenings. I informed her of [facility] services. Based on history, this is a/an  pregnancy complicated by gestational diabetes .   Patient Active Problem List   Diagnosis Date Noted   Supervision of high risk pregnancy, antepartum 02/14/2021   GDM (gestational diabetes mellitus) 02/14/2021   Obesity (BMI 30.0-34.9) 11/13/2016    Concerns addressed today  Delivery Plans:  Plans to deliver at [facility].   MyChart/Babyscripts MyChart access verified. I explained pt will have some visits in office and some virtually. Babyscripts instructions given and order placed.   Blood  Pressure Cuff  Patient has private insurance; instructed to purchase blood pressure cuff and bring to first prenatal appt. Explained after first prenatal appt pt will check weekly and document in Babyscripts.  Weight scale: Patient   dioes have weight scale.   Anatomy US Explained first scheduled [place] will be around 19 weeks. Anatomy US is already scheduled for 03/04/21 at 0815. Pt notified to arrive at 0800.  Labs Discussed Avelina Laine genetic screening with patient. She has already had initial labs and genetic screening done with  [facility].  Covid Vaccine Patient has had covid vaccine.   Social Determinants of Health Food Insecurity: Patient denies food insecurity. WIC Referral: Patient is not interested in referral to [facility]. She already has WIC.  Transportation: Patient denies transportation needs. Childcare: Discussed no children allowed at ultrasound appointments. Offered childcare services; patient declines childcare services at this time.  First visit review I reviewed new OB appt with pt. I explained  we will continue her care and not repeat what has already been done at [facility]. Her initial appointment was moved to [place] once determined she is high risk. She also asked that it be 7/1 or after. Explained pt will be seen by Dr.Eckstat at first visit; encounter routed to appropriate provider. Explained that patient will be seen by pregnancy navigator following visit with provider. [facility] information placed in AVS.   Summerlynn Glauser,RN 02/14/2021  2:11 PM

## 2021-02-14 NOTE — Patient Instructions (Signed)
  At our [facility], we work as an integrated team, providing care to address both physical and emotional health. Your medical provider may refer you to see our Behavioral Health Clinician (BHC) on the same day you see your medical provider, as availability permits; often scheduled virtually at your convenience.  Our BHC is available to all patients, visits generally last between 20-30 minutes, but can be longer or shorter, depending on patient need. The BHC offers help with stress management, coping with symptoms of depression and anxiety, major life changes , sleep issues, changing risky behavior, grief and loss, life stress, working on personal life goals, and  behavioral health issues, as these all affect your overall health and wellness.  The BHC is NOT available for the following: FMLA paperwork, court-ordered evaluations, specialty assessments (custody or disability), letters to employers, or obtaining certification for an emotional support animal. The BHC does not provide long-term therapy. You have the right to refuse integrated behavioral health services, or to reschedule to see the BHC at a later date.  Confidentiality exception: If it is suspected that a child or disabled adult is being abused or neglected, we are required by law to report that to either Child Protective Services or Adult Protective Services.  If you have a diagnosis of Bipolar affective disorder, Schizophrenia, or recurrent Major depressive disorder, we will recommend that you establish care with a psychiatrist, as these are lifelong, chronic conditions, and we want your overall emotional health and medications to be more closely monitored. If you anticipate needing extended maternity leave due to mental health issues postpartum, it it recommended you inform your medical provider, so we can put in a referral to a psychiatrist as soon as possible. The BHC is unable to recommend an extended maternity leave for mental  health issues. Your medical provider or BHC may refer you to a therapist for ongoing, traditional therapy, or to a psychiatrist, for medication management, if it would benefit your overall health. Depending on your insurance, you may have a copay or be charged a deductible, depending on your insurance, to see the BHC. If you are uninsured, it is recommended that you apply for financial assistance. (Forms may be requested at the front desk for in-person visits, via MyChart, or request a form during a virtual visit).  If you see the BHC more than 6 times, you will have to complete a comprehensive clinical assessment interview with the BHC to resume integrated services.  For virtual visits with the BHC, you must be physically in the state of [place] at the time of the visit. For example, if you live in [place], you will have to do an in-person visit with the BHC, and your out-of-state insurance may not cover behavioral health services in [place]. If you are going out of the state or country for any reason, the BHC may see you virtually when you return to [place], but not while you are physically outside of [place].    

## 2021-02-16 ENCOUNTER — Telehealth: Payer: Self-pay | Admitting: *Deleted

## 2021-02-16 NOTE — Telephone Encounter (Signed)
Needs to be schedule for echo per Dr. Crissie Reese

## 2021-02-21 NOTE — Telephone Encounter (Signed)
Front office notified to send referral to Dr. Casilda Carls office for feta echo.    Addison Naegeli, RN  02/21/21

## 2021-02-22 ENCOUNTER — Encounter: Payer: Managed Care, Other (non HMO) | Attending: Obstetrics and Gynecology | Admitting: Registered"

## 2021-02-22 ENCOUNTER — Ambulatory Visit: Payer: Managed Care, Other (non HMO) | Admitting: Registered"

## 2021-02-22 ENCOUNTER — Other Ambulatory Visit: Payer: Self-pay

## 2021-02-22 DIAGNOSIS — O24319 Unspecified pre-existing diabetes mellitus in pregnancy, unspecified trimester: Secondary | ICD-10-CM | POA: Insufficient documentation

## 2021-02-22 DIAGNOSIS — E119 Type 2 diabetes mellitus without complications: Secondary | ICD-10-CM | POA: Diagnosis present

## 2021-02-22 NOTE — Patient Instructions (Signed)
To help reduce your fasting blood sugar consider exercising 20 minutes sometime after dinner for 3 nights and see if you see a pattern of lower numbers. If you are hungry eat a snack in the evening. If getting a snack during the night have less carbs and more protein Continue exercising regardless of immediate effect on blood sugar 3-5 times per week. When eating fruit always include protein

## 2021-02-22 NOTE — Progress Notes (Signed)
Patient was seen on 02/22/21 for follow-up assessment and education for Diabetes in pregnancy. EDD 07/27/21; [redacted]w[redacted]d. Patient states making changes to diet/lifestyle including reducing carb intake and only drinking water.  A1c 6.3% 09/21/20  Patient received initial education at [facility] on 02/01/21. Patient CBGs have improved but still elevated. Although 2 hours after lunch blood sugars are more often in range they are at the upper limit with minimal carb intake.  Patient is testing blood glucose as directed pre breakfast and 2 hours after each meal. Review of Blood Glucose Log via MySugr app: FBG: 98 - 117 After Breakfast: 88-152 mg/dL After Lunch: 366-294 mg/dL After Dinner: 92 - 765 mg/dL  The following learning objectives reviewed during follow-up visit:  Exercise type, amount and timing to address FBS Role of snacks and creating balanced options  Interpreting A1c values Expected change in hormonal influence on insulin resistance in 3rd trimester Omnipod & CGM options that may be available to her if needed  Plan:  To help reduce your fasting blood sugar consider exercising 20 minutes sometime after dinner for 3 nights and see if you see a pattern of lower numbers. If you are hungry eat a snack in the evening. If getting a snack during the night have less carbs and more protein Continue exercising regardless of immediate effect on blood sugar 3-5 times per week. When eating fruit always include protein   Patient instructed to monitor glucose levels: FBS: 60 - 95 mg/dl 2 hour: <465 mg/dl  Patient received the following handouts: Balanced snack ideas A1c chart  Patient will be seen for follow-up in 1 weeks or as needed.

## 2021-03-02 ENCOUNTER — Ambulatory Visit: Payer: Managed Care, Other (non HMO)

## 2021-03-02 ENCOUNTER — Encounter: Payer: Managed Care, Other (non HMO) | Admitting: Certified Nurse Midwife

## 2021-03-03 ENCOUNTER — Encounter: Payer: Managed Care, Other (non HMO) | Attending: Obstetrics and Gynecology | Admitting: Registered"

## 2021-03-03 ENCOUNTER — Ambulatory Visit: Payer: Managed Care, Other (non HMO) | Admitting: Registered"

## 2021-03-03 ENCOUNTER — Other Ambulatory Visit: Payer: Self-pay

## 2021-03-03 DIAGNOSIS — O24319 Unspecified pre-existing diabetes mellitus in pregnancy, unspecified trimester: Secondary | ICD-10-CM

## 2021-03-03 DIAGNOSIS — E119 Type 2 diabetes mellitus without complications: Secondary | ICD-10-CM | POA: Diagnosis present

## 2021-03-04 ENCOUNTER — Encounter: Payer: Self-pay | Admitting: Family Medicine

## 2021-03-04 ENCOUNTER — Ambulatory Visit: Payer: Managed Care, Other (non HMO) | Attending: Obstetrics and Gynecology

## 2021-03-04 ENCOUNTER — Ambulatory Visit (INDEPENDENT_AMBULATORY_CARE_PROVIDER_SITE_OTHER): Payer: Managed Care, Other (non HMO) | Admitting: Family Medicine

## 2021-03-04 ENCOUNTER — Telehealth: Payer: Self-pay | Admitting: Family Medicine

## 2021-03-04 ENCOUNTER — Encounter: Payer: Self-pay | Admitting: *Deleted

## 2021-03-04 ENCOUNTER — Ambulatory Visit: Payer: Managed Care, Other (non HMO) | Admitting: *Deleted

## 2021-03-04 ENCOUNTER — Other Ambulatory Visit: Payer: Self-pay | Admitting: Obstetrics & Gynecology

## 2021-03-04 ENCOUNTER — Ambulatory Visit: Payer: Managed Care, Other (non HMO) | Attending: Obstetrics and Gynecology | Admitting: Obstetrics and Gynecology

## 2021-03-04 ENCOUNTER — Other Ambulatory Visit: Payer: Self-pay | Admitting: *Deleted

## 2021-03-04 VITALS — BP 120/74 | HR 96 | Wt 215.6 lb

## 2021-03-04 VITALS — BP 121/70 | HR 99

## 2021-03-04 DIAGNOSIS — O3432 Maternal care for cervical incompetence, second trimester: Secondary | ICD-10-CM

## 2021-03-04 DIAGNOSIS — O24419 Gestational diabetes mellitus in pregnancy, unspecified control: Secondary | ICD-10-CM | POA: Diagnosis present

## 2021-03-04 DIAGNOSIS — Z3A19 19 weeks gestation of pregnancy: Secondary | ICD-10-CM | POA: Diagnosis not present

## 2021-03-04 DIAGNOSIS — Z3A15 15 weeks gestation of pregnancy: Secondary | ICD-10-CM | POA: Insufficient documentation

## 2021-03-04 DIAGNOSIS — O24319 Unspecified pre-existing diabetes mellitus in pregnancy, unspecified trimester: Secondary | ICD-10-CM

## 2021-03-04 DIAGNOSIS — O26879 Cervical shortening, unspecified trimester: Secondary | ICD-10-CM

## 2021-03-04 DIAGNOSIS — O24112 Pre-existing diabetes mellitus, type 2, in pregnancy, second trimester: Secondary | ICD-10-CM

## 2021-03-04 DIAGNOSIS — O0990 Supervision of high risk pregnancy, unspecified, unspecified trimester: Secondary | ICD-10-CM

## 2021-03-04 DIAGNOSIS — O99212 Obesity complicating pregnancy, second trimester: Secondary | ICD-10-CM

## 2021-03-04 DIAGNOSIS — Z98891 History of uterine scar from previous surgery: Secondary | ICD-10-CM | POA: Insufficient documentation

## 2021-03-04 DIAGNOSIS — O34219 Maternal care for unspecified type scar from previous cesarean delivery: Secondary | ICD-10-CM

## 2021-03-04 MED ORDER — METFORMIN HCL 1000 MG PO TABS: 1000.0000 mg | ORAL_TABLET | Freq: Two times a day (BID) | ORAL | 5 refills | Status: DC

## 2021-03-04 NOTE — H&P (Signed)
Preoperative History and Physical  Toni Morgan is a 35 y.o. [id number] at [redacted]w[redacted]d here for rescue cervical cerclage placement after being diagnosed with markedly shortened cervix during ultrasound on 03/04/21. Denies any bleeding, LOF.    No significant preoperative concerns.  Proposed surgery: Transvaginal rescue cervical cerclage  Past Medical History:  Diagnosis Date   Abnormal Pap smear of cervix    H/O colposcopy with cervical biopsy    pt reports she needs LEEP but it is on hold due to pregnancy   Type 2 diabetes mellitus (HCC) 02/14/2021   Past Surgical History:  Procedure Laterality Date   CESAREAN SECTION N/A 07/02/2013   Procedure: CESAREAN SECTION;  Surgeon: Lavina Hamman, MD;  Location: [facility];  Service: Obstetrics;  Laterality: N/A;   COLPOSCOPY     HERNIA REPAIR  2002   WISDOM TOOTH EXTRACTION     OB History  Gravida Para Term Preterm AB Living  4 1 1   2 1   SAB IAB Ectopic Multiple Live Births  1 0     1    # Outcome Date GA Lbr Len/2nd Weight Sex Delivery Anes PTL Lv  4 Current           3 SAB 09/2020 [redacted]w[redacted]d         2 AB 2017          1 Term 07/02/13 [redacted]w[redacted]d   M CS-LTranv EPI  LIV     Birth Comments: GDM; c/s breech  Patient denies any other pertinent gynecologic issues.   No current facility-administered medications on file prior to encounter.   Current Outpatient Medications on File Prior to Encounter  Medication Sig Dispense Refill   Prenatal Vit-Fe Fumarate-FA (PRENATAL VITAMINS PO) Take 1 tablet by mouth daily.     Accu-Chek Softclix Lancets lancets 4 (four) times daily.     metFORMIN (GLUCOPHAGE) 1000 MG tablet Take 1 tablet (1,000 mg total) by mouth 2 (two) times daily with a meal. 60 tablet 5   promethazine (PHENERGAN) 25 MG tablet Take 25 mg by mouth every 6 (six) hours as needed for nausea or vomiting.     No Known Allergies  Social History:   reports that she has never smoked. She has never used smokeless tobacco. She reports previous alcohol use.  She reports that she does not use drugs.  Family History  Problem Relation Age of Onset   Hyperlipidemia Mother    Hypertension Mother    Ovarian cysts Sister    Diabetes Maternal Uncle    Cancer Maternal Grandmother     Review of Systems: Pertinent items noted in HPI and remainder of comprehensive ROS otherwise negative.  PHYSICAL EXAM: Blood pressure 126/72, pulse 99, temperature 99.1 F (37.3 C), temperature source Oral, resp. rate 18, height 5\' 8"  (1.727 m), weight 97.8 kg, last menstrual period 10/20/2020, SpO2 99 %, unknown if currently breastfeeding. FHR: 155 bpm on doppler CONSTITUTIONAL: Well-developed, well-nourished female in no acute distress.  SKIN: Skin is warm and dry. No rash noted. Not diaphoretic. No erythema. No pallor. NEUROLOGIC: Alert and oriented to person, place, and time. Normal reflexes, muscle tone coordination. No cranial nerve deficit noted. PSYCHIATRIC: Normal mood and affect. Normal behavior. Normal judgment and thought content. CARDIOVASCULAR: Normal heart rate noted, regular rhythm RESPIRATORY: Effort and breath sounds normal, no problems with respiration noted ABDOMEN: Soft, gravid, nontender, nondistended. PELVIC: Deferred MUSCULOSKELETAL: Normal range of motion. No edema and no tenderness. 2+ distal pulses.  Labs: Results for orders placed  or performed during the hospital encounter of 03/05/21 (from the past 336 hour(s))  Type and screen [facility]   Collection Time: 03/05/21 12:05 PM  Result Value Ref Range   ABO/RH(D) PENDING    Antibody Screen PENDING    Sample Expiration      03/08/2021,2359 Performed at [facility], [address], [place], [place] [address]     Imaging Studies: [place] MFM OB Transvaginal  Result Date: 03/04/2021 ----------------------------------------------------------------------  OBSTETRICS REPORT                       (Signed Final 03/04/2021 11:52 am)  ---------------------------------------------------------------------- Patient Info  ID #:       [id number]                          D.O.B.:  Oct 10, 1985 (35 yrs)  Name:       Toni Morgan                   Visit Date: 03/04/2021 09:42 am ---------------------------------------------------------------------- Performed By  Attending:        Noralee Space MD        Secondary Phy.:   [facility]                                                             [facility]  Performed By:     Emeline Darling BS,      Address:          [address]                    [address]                                                             [place], [place]                                                             [address]  Referred By:      Cherylin Mylar-        Location:         [facility]                    [facility]                                  Fetal Care at                                                             [facility]  Ref. Address:     [facility]                    OBGYN                    [address]                    [place] ---------------------------------------------------------------------- Orders  #  Description                           Code        Ordered By  1  [place] MFM OB DETAIL +14 WK               [id number]    Janey Greaser  2  [place] MFM OB TRANSVAGINAL                [address].6     Yehuda Mao                                                       [facility] ----------------------------------------------------------------------  #  Order #                     Accession #                Episode #  1  [id number]                   [id number]                 [id number]  2  [id number]                   [id number]                 [id number] ---------------------------------------------------------------------- Indications  [redacted] weeks gestation of  pregnancy                Z3A.19  Encounter for antenatal screening for          Z36.3  malformations  Pre-existing diabetes, type 2, in pregnancy,   O24.112  second trimester  Obesity complicating pregnancy, second         O99.212  trimester(BMI 89)  Previous cesarean delivery, antepartum         O34.219  Cervical incompetence, second trimester        O34.32 ---------------------------------------------------------------------- Vital Signs  Weight (lb): 215                               Height:        5'8"  BMI:         32.69 ---------------------------------------------------------------------- Fetal Evaluation  Num Of Fetuses:  1  Fetal Heart Rate(bpm):  150  Cardiac Activity:       Observed  Presentation:           Breech  Placenta:               Anterior  P. Cord Insertion:      Visualized  Amniotic Fluid  AFI FV:      Within normal limits                              Largest Pocket(cm)                              4.4 ---------------------------------------------------------------------- Biometry  BPD:      44.7  mm     G. Age:  19w 4d         61  %    CI:        75.93   %    70 - 86                                                          FL/HC:      18.2   %    16.1 - 18.3  HC:      162.6  mm     G. Age:  19w 0d         30  %    HC/AC:      1.20        1.09 - 1.39  AC:      135.9  mm     G. Age:  19w 0d         37  %    FL/BPD:     66.2   %  FL:       29.6  mm     G. Age:  19w 1d         37  %    FL/AC:      21.8   %    20 - 24  HUM:      28.1  mm     G. Age:  19w 0d         45  %  CER:      18.4  mm     G. Age:  18w 1d         10  %  NFT:       3.3  mm  LV:        5.8  mm  CM:        3.3  mm  Est. FW:     273  gm    0 lb 10 oz      34  % ---------------------------------------------------------------------- OB History  Gravidity:    4         Term:   1        Prem:   0        SAB:   1  TOP:          1       Ectopic:  0        Living: 1  ---------------------------------------------------------------------- Gestational Age  LMP:  19w 2d        Date:  10/20/20                 EDD:   07/27/21  U/S Today:     19w 1d                                        EDD:   07/28/21  Best:          19w 2d     Det. By:  LMP  (10/20/20)          EDD:   07/27/21 ---------------------------------------------------------------------- Anatomy  Cranium:               Appears normal         Aortic Arch:            Appears normal  Cavum:                 Appears normal         Ductal Arch:            Appears normal  Ventricles:            Appears normal         Diaphragm:              Appears normal  Choroid Plexus:        Appears normal         Stomach:                Appears normal, left                                                                        sided  Cerebellum:            Appears normal         Abdomen:                Appears normal  Posterior Fossa:       Appears normal         Abdominal Wall:         Appears nml (cord                                                                        insert, abd wall)  Nuchal Fold:           Appears normal         Cord Vessels:           Appears normal (3  vessel cord)  Face:                  Orbits appear          Kidneys:                Appear normal                         normal  Lips:                  Not well visualized    Bladder:                Appears normal  Thoracic:              Appears normal         Spine:                  Appears normal  Heart:                 Not well visualized    Upper Extremities:      Appears normal  RVOT:                  Not well visualized    Lower Extremities:      Appears normal  LVOT:                  Not well visualized  Other:  Fetus appears to be female. Technically difficult due to maternal          habitus and fetal position.  ---------------------------------------------------------------------- Cervix Uterus Adnexa  Cervix  Dilated  Adnexa  No abnormality visualized. ---------------------------------------------------------------------- Impression  We performed a fetal anatomy scan.  Fetal biometry is  consistent with the previously established dates.  Amniotic  fluid is normal and good fetal activity seen.  No markers of  aneuploidies or obvious fetal structural defects are seen.  Fetal anatomical survey could not be completed because of  fetal position.  Placenta is anterior and there is no evidence  of previa or placenta accreta spectrum.  Because of the appearance of cervical shortening and  funneling, we performed a transvaginal ultrasound.  On  transvaginal ultrasound, the cervical canal appears dilated  and no measurable portion of the cervix was seen.  After explaining, I performed a sterile speculum examination.  The external os was closed and no visible membrane was  seen.  The cervix appears healthy.  On digital examination  the cervix was about 1.5 cm dilated and the external os was  closed.  xxxxxxxxxxxxxxxxxxxxxxxxxxxxxxxxxxxxxxxxxxxxxxxxxx  I had the pleasure of seeing Ms. Welles [facility] at the [facility].  She is here for fetal anatomy scan.  She  was referred by Dr. Rutherford Nail [facility]), but  later transferred her [facility] to [facility].  She has early diagnosis of gestational diabetes and  had consultation with our diabetic educator.  She will be  starting her blood glucose measurements.  Obstetric history significant for a term cesarean delivery in  2014 of a female infant weighing 8 pounds 10 ounces at birth.  At [redacted] weeks gestation, the cervix was 2 cm dilated and the  patient was managed expectantly.  She later carried the  pregnancy to term and was delivered at [redacted] weeks gestation.  She had 1 early termination of pregnancy and 1 spontaneous  miscarriage (January  2022).  GYN history: She had abnormal Pap smear (HGSIL) and had  colposcopy and biopsy in February 2022. LEEP was deferred  because of her pregnancy.  Past medical history: No history of diabetes or hypertension  or any chronic medical conditions.  She has an early  diagnosis of gestational diabetes.  Her hemoglobin A1c was  6.3%.  Past surgical history: Cesarean section.  Allergies: No known drug allergies.  Medications: Prenatal vitamins.  Social history: Denies tobacco or drug or alcohol use.  She  works in a Physiological scientist (assembly line).  Prenatal course: On cell free fetal DNA screening, the risks of  fetal aneuploidies are not increased.  Our concerns include:  Cervical insufficiency  -I explained the findings with the help of diagrams and  ultrasound images.  Most likely cause is abnormal Pap smear  (high-grade lesion) and history of cervical dilation and  previous pregnancy.  -The cervical canal is dilated and no measurable portion of  the cervix is seen.  I have recommended rescue cerclage.  I  explained the procedure and possible complications including  miscarriage, bleeding, infection, and injury to bladder or  bowel (rare).  Alternatively, vaginal progesterone is an option.  However, it is no inferior treatment if cervix measures less  than 1 cm.  -I counseled her that cerclage does not guarantee carrying  pregnancy to term but seems a superior option to expectant  management.  -Patient would like to have her procedure performed  tomorrow.  -She does not endorse symptoms of uterine contractions or  vaginal bleeding.  I discussed with Dr. Shawnie Pons who will be scheduling rescue  cerclage to be performed tomorrow. ---------------------------------------------------------------------- Recommendations  -Rescue cerclage tomorrow.  -An appointment was made for her to return in 2 weeks for  transvaginal ultrasound and in 4 weeks for completion of fetal  anatomy.  ----------------------------------------------------------------------                  Noralee Space, MD Electronically Signed Final Report   03/04/2021 11:52 am ----------------------------------------------------------------------  [place] MFM OB DETAIL +14 WK  Result Date: 03/04/2021 ----------------------------------------------------------------------  OBSTETRICS REPORT                       (Signed Final 03/04/2021 11:52 am) ---------------------------------------------------------------------- Patient Info  ID #:       [id number]                          D.O.B.:  [birth year] (35 yrs)  Name:       MADOLYN ACKROYD Assefa                   Visit Date: 03/04/2021 09:42 am ---------------------------------------------------------------------- Performed By  Attending:        Noralee Space MD        Secondary Phy.:   [facility]                                                             for Women  Performed By:     Emeline Darling BS,      Address:          [address]  [address]                                                             [place], [place]                                                             [address]  Referred By:      Cherylin Mylar-        Location:         [facility]                    [facility]                                  Fetal Care at                                                             [facility]                                                             [facility]  Ref. Address:     [facility]                    OBGYN                    [address]                    [place] ---------------------------------------------------------------------- Orders  #  Description                           Code        Ordered By  1  [place] MFM OB DETAIL +14 WK               [id number]    Janey Greaser  2  [place] MFM OB TRANSVAGINAL                [address].0     ROSALEA TAAM-  [facility] ----------------------------------------------------------------------  #  Order #                     Accession #                Episode #  1  [id number]                   [id number]                 [id number]  2  [id number]                   [id number]                 [id number] ---------------------------------------------------------------------- Indications  [redacted] weeks gestation of pregnancy                Z3A.19  Encounter for antenatal screening for          Z36.3  malformations  Pre-existing diabetes, type 2, in pregnancy,   O24.112  second trimester  Obesity complicating pregnancy, second         O99.212  trimester(BMI 39)  Previous cesarean delivery, antepartum         O34.219  Cervical incompetence, second trimester        O34.32 ---------------------------------------------------------------------- Vital Signs  Weight (lb): 215                               Height:        5'8"  BMI:         32.69 ---------------------------------------------------------------------- Fetal Evaluation  Num Of Fetuses:         1  Fetal Heart Rate(bpm):  150  Cardiac Activity:       Observed  Presentation:           Breech  Placenta:               Anterior  P. Cord Insertion:      Visualized  Amniotic Fluid  AFI FV:      Within normal limits                              Largest Pocket(cm)                              4.4 ---------------------------------------------------------------------- Biometry  BPD:      44.7  mm     G. Age:  19w 4d         61  %    CI:        75.93   %    70 - 86                                                          FL/HC:      18.2   %    16.1 - 18.3  HC:      162.6  mm     G. Age:  19w 0d         30  %    HC/AC:      1.20  1.09 - 1.39  AC:      135.9  mm     G. Age:  19w 0d         37  %    FL/BPD:     66.2   %  FL:       29.6  mm     G. Age:  19w 1d         37  %    FL/AC:      21.8   %    20 - 24  HUM:      28.1  mm     G. Age:  19w 0d         45  %  CER:       18.4  mm     G. Age:  18w 1d         10  %  NFT:       3.3  mm  LV:        5.8  mm  CM:        3.3  mm  Est. FW:     273  gm    0 lb 10 oz      34  % ---------------------------------------------------------------------- OB History  Gravidity:    4         Term:   1        Prem:   0        SAB:   1  TOP:          1       Ectopic:  0        Living: 1 ---------------------------------------------------------------------- Gestational Age  LMP:           19w 2d        Date:  10/20/20                 EDD:   07/27/21  U/S Today:     19w 1d                                        EDD:   07/28/21  Best:          19w 2d     Det. By:  LMP  (10/20/20)          EDD:   07/27/21 ---------------------------------------------------------------------- Anatomy  Cranium:               Appears normal         Aortic Arch:            Appears normal  Cavum:                 Appears normal         Ductal Arch:            Appears normal  Ventricles:            Appears normal         Diaphragm:              Appears normal  Choroid Plexus:        Appears normal         Stomach:                Appears normal, left  sided  Cerebellum:            Appears normal         Abdomen:                Appears normal  Posterior Fossa:       Appears normal         Abdominal Wall:         Appears nml (cord                                                                        insert, abd wall)  Nuchal Fold:           Appears normal         Cord Vessels:           Appears normal (3                                                                        vessel cord)  Face:                  Orbits appear          Kidneys:                Appear normal                         normal  Lips:                  Not well visualized    Bladder:                Appears normal  Thoracic:              Appears normal         Spine:                  Appears normal  Heart:                 Not well  visualized    Upper Extremities:      Appears normal  RVOT:                  Not well visualized    Lower Extremities:      Appears normal  LVOT:                  Not well visualized  Other:  Fetus appears to be female. Technically difficult due to maternal          habitus and fetal position. ---------------------------------------------------------------------- Cervix Uterus Adnexa  Cervix  Dilated  Adnexa  No abnormality visualized. ---------------------------------------------------------------------- Impression  We performed a fetal anatomy scan.  Fetal biometry is  consistent with the previously established dates.  Amniotic  fluid is normal and good fetal activity seen.  No markers of  aneuploidies or obvious fetal structural defects are seen.  Fetal anatomical survey could not  be completed because of  fetal position.  Placenta is anterior and there is no evidence  of previa or placenta accreta spectrum.  Because of the appearance of cervical shortening and  funneling, we performed a transvaginal ultrasound.  On  transvaginal ultrasound, the cervical canal appears dilated  and no measurable portion of the cervix was seen.  After explaining, I performed a sterile speculum examination.  The external os was closed and no visible membrane was  seen.  The cervix appears healthy.  On digital examination  the cervix was about 1.5 cm dilated and the external os was  closed.  xxxxxxxxxxxxxxxxxxxxxxxxxxxxxxxxxxxxxxxxxxxxxxxxxx  I had the pleasure of seeing Ms. Surber today at the [facility].  She is here for fetal anatomy scan.  She  was referred by Dr. Rutherford Nail [facility]), but  later transferred her prenatal [facility] to the [facility].  She has early diagnosis of gestational diabetes and  had consultation with our diabetic educator.  She will be  starting her blood glucose measurements.  Obstetric history significant for a term cesarean delivery in  2014 of a female infant  weighing 8 pounds 10 ounces at birth.  At [redacted] weeks gestation, the cervix was 2 cm dilated and the  patient was managed expectantly.  She later carried the  pregnancy to term and was delivered at [redacted] weeks gestation.  She had 1 early termination of pregnancy and 1 spontaneous  miscarriage (January 2022).  GYN history: She had abnormal Pap smear (HGSIL) and had  colposcopy and biopsy in February 2022. LEEP was deferred  because of her pregnancy.  Past medical history: No history of diabetes or hypertension  or any chronic medical conditions.  She has an early  diagnosis of gestational diabetes.  Her hemoglobin A1c was  6.3%.  Past surgical history: Cesarean section.  Allergies: No known drug allergies.  Medications: Prenatal vitamins.  Social history: Denies tobacco or drug or alcohol use.  She  works in a Physiological scientist (assembly line).  Prenatal course: On cell free fetal DNA screening, the risks of  fetal aneuploidies are not increased.  Our concerns include:  Cervical insufficiency  -I explained the findings with the help of diagrams and  ultrasound images.  Most likely cause is abnormal Pap smear  (high-grade lesion) and history of cervical dilation and  previous pregnancy.  -The cervical canal is dilated and no measurable portion of  the cervix is seen.  I have recommended rescue cerclage.  I  explained the procedure and possible complications including  miscarriage, bleeding, infection, and injury to bladder or  bowel (rare).  Alternatively, vaginal progesterone is an option.  However, it is no inferior treatment if cervix measures less  than 1 cm.  -I counseled her that cerclage does not guarantee carrying  pregnancy to term but seems a superior option to expectant  management.  -Patient would like to have her procedure performed  tomorrow.  -She does not endorse symptoms of uterine contractions or  vaginal bleeding.  I discussed with Dr. Shawnie Pons who will be scheduling rescue  cerclage to be performed  tomorrow. ---------------------------------------------------------------------- Recommendations  -Rescue cerclage tomorrow.  -An appointment was made for her to return in 2 weeks for  transvaginal ultrasound and in 4 weeks for completion of fetal  anatomy. ----------------------------------------------------------------------                  Noralee Space, MD Electronically Signed Final Report  03/04/2021 11:52 am ----------------------------------------------------------------------   Assessment: Patient Active Problem List   Diagnosis Date Noted   History of cesarean delivery 03/04/2021   Short cervix affecting pregnancy 03/04/2021   Pre-existing diabetes mellitus during pregnancy, antepartum 02/22/2021   Supervision of high risk pregnancy, antepartum 02/14/2021   Obesity (BMI 30.0-34.9) 11/13/2016    Plan: Patient will undergo rescue transvaginal cerclage placement at [redacted]w[redacted]d.   The risks of surgery were discussed in detail with the patient including but not limited to: bleeding; infection which may require antibiotic therapy; injury to cervix, vagina other surrounding organs; risk of ruptured membranes and/or preterm delivery and other postoperative or anesthesia complications.. Likelihood of success in alleviating the patient's condition was discussed.  Routine postoperative instructions will be reviewed with the patient and her family in detail after surgery.  The patient concurred with the proposed plan, giving informed written consent for the surgery.  Patient has been NPO since last night and she will remain NPO for procedure.  Anesthesia and OR aware.  Preoperative SCDs ordered on call to the OR.  To OR when ready.   Jaynie Collins, MD, FACOG Obstetrician & Gynecologist, [facility] for [employer], [facility]

## 2021-03-04 NOTE — Progress Notes (Signed)
   Subjective:  Toni Morgan is a 35 y.o. [phone] at [redacted]w[redacted]d being seen today for ongoing prenatal care.  She is currently monitored for the following issues for this high-risk pregnancy and has Supervision of high risk pregnancy, antepartum; Obesity (BMI 30.0-34.9); Type 2 diabetes mellitus (HCC); Pre-existing diabetes mellitus during pregnancy, antepartum; History of cesarean delivery; and Short cervix affecting pregnancy on their problem list.  Patient reports no complaints.  Contractions: Not present. Vag. Bleeding: None.  Movement: Present. Denies leaking of fluid.   The following portions of the patient's history were reviewed and updated as appropriate: allergies, current medications, past family history, past medical history, past social history, past surgical history and problem list. Problem list updated.  Objective:   Vitals:   03/04/21 1013  BP: 120/74  Pulse: 96  Weight: 215 lb 9.6 oz (97.8 kg)    Fetal Status: Fetal Heart Rate (bpm): 147   Movement: Present     General:  Alert, oriented and cooperative. Patient is in no acute distress.  Skin: Skin is warm and dry. No rash noted.   Cardiovascular: Normal heart rate noted  Respiratory: Normal respiratory effort, no problems with respiration noted  Abdomen: Soft, gravid, appropriate for gestational age. Pain/Pressure: Present     Pelvic: Vag. Bleeding: None     Cervical exam deferred        Extremities: Normal range of motion.  Edema: None  Mental Status: Normal mood and affect. Normal behavior. Normal judgment and thought content.   Urinalysis:      Assessment and Plan:  Pregnancy: G4P1021 at [redacted]w[redacted]d  1. Supervision of high risk pregnancy, antepartum BP and FHR normal Will extract labs Transfer from [facility] due to insurance issues Oriented to nature of practice, large team structure, multiple learners Would like BTL after delivery  2. Pre-existing diabetes mellitus during pregnancy, antepartum Initial A1c 6.8%  on outside labs Log reviewed, most sugars not at goal but not severely elevated She would like to avoid insulin and I think its reasonable to trial metformin first, will start at 500mg  BID for one week and then increase to 1g BID, RTC in 2 weeks for log review and see if insulin needs to be added Will be following regularly w MFM  3. History of cesarean delivery For breech Desires TOLAC, BTL Sign papers at 28 weeks, consider sooner given hx below  4. Short cervix affecting pregnancy Last pregnancy in 2014, notable for preterm dilation at 29wks managed w bedrest, remained 2cm until 35 wks, ultimately delivered by primary CS at 39 weeks for breech Patient here after MFM visit Patients cervix severely shortened, she will have rescue cerclage placed tomorrow  Preterm labor symptoms and general obstetric precautions including but not limited to vaginal bleeding, contractions, leaking of fluid and fetal movement were reviewed in detail with the patient. Please refer to After Visit Summary for other counseling recommendations.  Return in 4 weeks (on 04/01/2021) for [facility], ob visit.   [facility], MD

## 2021-03-04 NOTE — Progress Notes (Signed)
Maternal-Fetal Medicine   Name: Toni Morgan DOB: 10/25/1995 MRN: [id number] Referring Provider: Darrell Jewel, MD  I had the pleasure of seeing Toni Morgan today at the Center for [facility].  She is here for fetal anatomy scan.  She was referred by Dr. Rutherford Nail [facility]), but later transferred her prenatal care to the [facility].  She has early diagnosis of gestational diabetes and had consultation with our diabetic educator.  She will be starting her blood glucose measurements.  Obstetric history significant for a term cesarean delivery in 2014 of a female infant weighing 8 pounds 10 ounces at birth.  At [redacted] weeks gestation, the cervix was 2 cm dilated and the patient was managed expectantly.  She later carried the pregnancy to term and was delivered at [redacted] weeks gestation. She had 1 early termination of pregnancy and 1 spontaneous miscarriage (January 2022).  GYN history: She had abnormal Pap smear (HGSIL) and had colposcopy and biopsy in February 2022. LEEP was deferred because of her pregnancy. Past medical history: No history of diabetes or hypertension or any chronic medical conditions.  She has an early diagnosis of gestational diabetes.  Her hemoglobin A1c was 6.3%. Past surgical history: Cesarean section. Allergies: No known drug allergies. Medications: Prenatal vitamins. Social history: Denies tobacco or drug or alcohol use.  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 Prenatal course: On cell free fetal DNA screening, the risks of fetal aneuploidies are not increased.  Ultrasound We performed a fetal anatomy scan.  Fetal biometry is consistent with the previously established dates.  Amniotic fluid is normal and good fetal activity seen.  No markers of aneuploidies or obvious fetal structural defects are seen.  Fetal anatomical survey could not be completed because of fetal position.  Placenta is anterior and there is no evidence of previa or  placenta accreta spectrum.  Because of the appearance of cervical shortening and funneling, we performed a transvaginal ultrasound.  On transvaginal ultrasound, the cervical canal appears dilated and no measurable portion of the cervix was seen.  After explaining, I performed a sterile speculum examination.  The external os was closed and no visible membrane was seen.  The cervix appears healthy.  On digital examination the cervix was about 1.5 cm dilated and the external os was closed.  Our concerns include: Cervical insufficiency -I explained the findings with the help of diagrams and ultrasound images.  Most likely cause is abnormal Pap smear (high-grade lesion) and history of cervical dilation and previous pregnancy. -The cervical canal is dilated and no measurable portion of the cervix is seen.  I have recommended rescue cerclage.  I explained the procedure and possible complications including miscarriage, bleeding, infection, and injury to bladder or bowel (rare).  Alternatively, vaginal progesterone is an option.  However, it is no inferior treatment if cervix measures less than 1 cm.  -I counseled her that cerclage does not guarantee carrying pregnancy to term but seems a superior option to expectant management.  -Patient would like to have her procedure performed tomorrow.  -She does not endorse symptoms of uterine contractions or vaginal bleeding.  I discussed with Dr. Shawnie Pons who will be scheduling rescue cerclage to be performed tomorrow.  Recommendations: -Rescue cerclage tomorrow. -An appointment was made for her to return in 2 weeks for transvaginal ultrasound and in 4 weeks for completion of fetal anatomy.  Thank you for consultation.  If you have any questions or concerns, please contact me the Center for maternal[facility]etal  care.  Consultation including face-to-face counseling 45 minutes.

## 2021-03-04 NOTE — Telephone Encounter (Signed)
Call from MFM about need for rescue cerclage-- Have booked case and called patient to discuss timing, when to arrive, NPO status.

## 2021-03-04 NOTE — Patient Instructions (Signed)

## 2021-03-05 ENCOUNTER — Ambulatory Visit (HOSPITAL_COMMUNITY)
Admission: RE | Admit: 2021-03-05 | Discharge: 2021-03-05 | Disposition: A | Discharge status: Home / Self Care | Payer: Managed Care, Other (non HMO) | Attending: Obstetrics & Gynecology | Admitting: Obstetrics & Gynecology

## 2021-03-05 ENCOUNTER — Other Ambulatory Visit: Payer: Self-pay

## 2021-03-05 ENCOUNTER — Encounter (HOSPITAL_COMMUNITY): Payer: Self-pay | Admitting: Obstetrics & Gynecology

## 2021-03-05 ENCOUNTER — Inpatient Hospital Stay (HOSPITAL_COMMUNITY)
Admission: AD | Admit: 2021-03-05 | Discharge: 2021-03-05 | Disposition: A | Discharge status: Still In Hospital | Payer: Managed Care, Other (non HMO) | Source: Home / Self Care | Attending: Obstetrics & Gynecology | Admitting: Obstetrics & Gynecology

## 2021-03-05 ENCOUNTER — Inpatient Hospital Stay (HOSPITAL_COMMUNITY): Payer: Managed Care, Other (non HMO) | Admitting: Anesthesiology

## 2021-03-05 ENCOUNTER — Encounter (HOSPITAL_COMMUNITY)
Admission: RE | Disposition: A | Discharge status: Home / Self Care | Payer: Self-pay | Source: Home / Self Care | Attending: Obstetrics & Gynecology

## 2021-03-05 DIAGNOSIS — O09299 Supervision of pregnancy with other poor reproductive or obstetric history, unspecified trimester: Secondary | ICD-10-CM

## 2021-03-05 DIAGNOSIS — Z833 Family history of diabetes mellitus: Secondary | ICD-10-CM | POA: Diagnosis not present

## 2021-03-05 DIAGNOSIS — Z842 Family history of other diseases of the genitourinary system: Secondary | ICD-10-CM | POA: Insufficient documentation

## 2021-03-05 DIAGNOSIS — O24912 Unspecified diabetes mellitus in pregnancy, second trimester: Secondary | ICD-10-CM | POA: Insufficient documentation

## 2021-03-05 DIAGNOSIS — Z809 Family history of malignant neoplasm, unspecified: Secondary | ICD-10-CM | POA: Insufficient documentation

## 2021-03-05 DIAGNOSIS — Z3A19 19 weeks gestation of pregnancy: Secondary | ICD-10-CM | POA: Diagnosis not present

## 2021-03-05 DIAGNOSIS — O26872 Cervical shortening, second trimester: Secondary | ICD-10-CM | POA: Insufficient documentation

## 2021-03-05 DIAGNOSIS — Z3A20 20 weeks gestation of pregnancy: Secondary | ICD-10-CM | POA: Insufficient documentation

## 2021-03-05 DIAGNOSIS — O3432 Maternal care for cervical incompetence, second trimester: Secondary | ICD-10-CM | POA: Diagnosis present

## 2021-03-05 DIAGNOSIS — Z8249 Family history of ischemic heart disease and other diseases of the circulatory system: Secondary | ICD-10-CM | POA: Diagnosis not present

## 2021-03-05 HISTORY — PX: CERVICAL CERCLAGE: SHX1329

## 2021-03-05 HISTORY — DX: Unspecified abnormal cytological findings in specimens from cervix uteri: R87.619

## 2021-03-05 LAB — CBC WITH DIFFERENTIAL/PLATELET
Abs Immature Granulocytes: 0.14 10*3/uL — ABNORMAL HIGH (ref 0.00–0.07)
Basophils Absolute: 0.1 10*3/uL (ref 0.0–0.1)
Basophils Relative: 1 %
Eosinophils Absolute: 0.1 10*3/uL (ref 0.0–0.5)
Eosinophils Relative: 1 %
HCT: 34.5 % — ABNORMAL LOW (ref 36.0–46.0)
Hemoglobin: 11.7 g/dL — ABNORMAL LOW (ref 12.0–15.0)
Immature Granulocytes: 1 %
Lymphocytes Relative: 21 %
Lymphs Abs: 2.2 10*3/uL (ref 0.7–4.0)
MCH: 28.5 pg (ref 26.0–34.0)
MCHC: 33.9 g/dL (ref 30.0–36.0)
MCV: 83.9 fL (ref 80.0–100.0)
Monocytes Absolute: 0.8 10*3/uL (ref 0.1–1.0)
Monocytes Relative: 7 %
Neutro Abs: 7.2 10*3/uL (ref 1.7–7.7)
Neutrophils Relative %: 69 %
Platelets: 216 10*3/uL (ref 150–400)
RBC: 4.11 MIL/uL (ref 3.87–5.11)
RDW: 13.2 % (ref 11.5–15.5)
WBC: 10.5 10*3/uL (ref 4.0–10.5)
nRBC: 0 % (ref 0.0–0.2)

## 2021-03-05 LAB — TYPE AND SCREEN
ABO/RH(D): B POS
Antibody Screen: NEGATIVE

## 2021-03-05 LAB — PROTEIN / CREATININE RATIO, URINE
Creatinine, Urine: 169.4 mg/dL
Protein, Ur: 11.2 mg/dL
Protein/Creat Ratio: 66 mg/g creat (ref 0–200)

## 2021-03-05 LAB — GLUCOSE, CAPILLARY
Glucose-Capillary: 72 mg/dL (ref 70–99)
Glucose-Capillary: 83 mg/dL (ref 70–99)

## 2021-03-05 SURGERY — CERCLAGE, CERVIX, VAGINAL APPROACH
Anesthesia: Spinal

## 2021-03-05 MED ORDER — DOCUSATE SODIUM 100 MG PO CAPS: 100.0000 mg | ORAL_CAPSULE | Freq: Two times a day (BID) | ORAL | 2 refills | Status: DC | PRN

## 2021-03-05 MED ORDER — FENTANYL CITRATE (PF) 100 MCG/2ML IJ SOLN
INTRAMUSCULAR | Status: DC | PRN
  Administered 2021-03-05: 100 ug via INTRAVENOUS
  Administered 2021-03-05: 50 ug via INTRAVENOUS
  Administered 2021-03-05: 100 ug via INTRAVENOUS

## 2021-03-05 MED ORDER — ACETAMINOPHEN 500 MG PO TABS
ORAL_TABLET | ORAL | Status: AC
  Filled 2021-03-05: qty 2

## 2021-03-05 MED ORDER — INDOMETHACIN 50 MG RE SUPP
100.0000 mg | Freq: Once | RECTAL | Status: DC
  Administered 2021-03-05: 100 mg via RECTAL

## 2021-03-05 MED ORDER — INDOMETHACIN 50 MG RE SUPP
100.0000 mg | Freq: Once | RECTAL | Status: DC
  Filled 2021-03-05 (×2): qty 2

## 2021-03-05 MED ORDER — ACETAMINOPHEN 500 MG PO TABS: 1000.0000 mg | ORAL_TABLET | ORAL | Status: DC

## 2021-03-05 MED ORDER — POVIDONE-IODINE 10 % EX SWAB
2.0000 "application " | Freq: Once | CUTANEOUS | Status: AC
  Administered 2021-03-05: 2 via TOPICAL

## 2021-03-05 MED ORDER — SOD CITRATE-CITRIC ACID 500-334 MG/5ML PO SOLN: 30.0000 mL | ORAL | Status: DC

## 2021-03-05 MED ORDER — LACTATED RINGERS IV SOLN: INTRAVENOUS | Status: DC

## 2021-03-05 MED ORDER — POVIDONE-IODINE 10 % EX SWAB: 2.0000 "application " | Freq: Once | CUTANEOUS | Status: DC

## 2021-03-05 MED ORDER — OXYCODONE-ACETAMINOPHEN 5-325 MG PO TABS: 1.0000 | ORAL_TABLET | ORAL | 0 refills | Status: DC | PRN

## 2021-03-05 MED ORDER — ONDANSETRON HCL 4 MG/2ML IJ SOLN
INTRAMUSCULAR | Status: DC | PRN
  Administered 2021-03-05: 4 mg via INTRAVENOUS

## 2021-03-05 MED ORDER — FENTANYL CITRATE (PF) 250 MCG/5ML IJ SOLN
INTRAMUSCULAR | Status: AC
  Filled 2021-03-05: qty 5

## 2021-03-05 MED ORDER — ACETAMINOPHEN 500 MG PO TABS
1000.0000 mg | ORAL_TABLET | ORAL | Status: AC
  Administered 2021-03-05: 1000 mg via ORAL

## 2021-03-05 MED ORDER — PROGESTERONE 200 MG PO CAPS: 200.0000 mg | ORAL_CAPSULE | Freq: Every day | ORAL | 2 refills | Status: DC

## 2021-03-05 MED ORDER — ONDANSETRON HCL 4 MG/2ML IJ SOLN
INTRAMUSCULAR | Status: AC
  Filled 2021-03-05: qty 2

## 2021-03-05 MED ORDER — LACTATED RINGERS IV SOLN: INTRAVENOUS | Status: DC | PRN

## 2021-03-05 MED ORDER — BUPIVACAINE IN DEXTROSE 0.75-8.25 % IT SOLN
INTRATHECAL | Status: DC | PRN
  Administered 2021-03-05: 1.2 mL via INTRATHECAL

## 2021-03-05 SURGICAL SUPPLY — 17 items
CANISTER SUCT 3000ML PPV (MISCELLANEOUS) ×2 IMPLANT
ELECT REM PT RETURN 9FT ADLT (ELECTROSURGICAL)
ELECTRODE REM PT RTRN 9FT ADLT (ELECTROSURGICAL) IMPLANT
GLOVE BIOGEL PI IND STRL 7.0 (GLOVE) ×3 IMPLANT
GLOVE BIOGEL PI INDICATOR 7.0 (GLOVE) ×3
GLOVE ECLIPSE 7.0 STRL STRAW (GLOVE) ×2 IMPLANT
GOWN STRL REUS W/TWL LRG LVL3 (GOWN DISPOSABLE) ×4 IMPLANT
PACK VAGINAL MINOR WOMEN LF (CUSTOM PROCEDURE TRAY) ×2 IMPLANT
PAD OB MATERNITY 4.3X12.25 (PERSONAL CARE ITEMS) ×2 IMPLANT
PAD PREP 24X48 CUFFED NSTRL (MISCELLANEOUS) ×2 IMPLANT
PENCIL BUTTON HOLSTER BLD 10FT (ELECTRODE) IMPLANT
SUT PROLENE 1 CT 1 30 (SUTURE) ×2 IMPLANT
SYR BULB IRRIGATION 50ML (SYRINGE) IMPLANT
TOWEL OR 17X24 6PK STRL BLUE (TOWEL DISPOSABLE) ×4 IMPLANT
TRAY FOLEY W/BAG SLVR 14FR (SET/KITS/TRAYS/PACK) ×2 IMPLANT
TUBING NON-CON 1/4 X 20 CONN (TUBING) IMPLANT
YANKAUER SUCT BULB TIP NO VENT (SUCTIONS) IMPLANT

## 2021-03-05 NOTE — Anesthesia Preprocedure Evaluation (Addendum)
Anesthesia Evaluation  Patient identified by MRN, date of birth, ID band Patient awake    Reviewed: Allergy & Precautions, H&P , NPO status , Patient's Chart, lab work & pertinent test results  Airway Mallampati: II  TM Distance: >3 FB Neck ROM: Full    Dental no notable dental hx.    Pulmonary neg pulmonary ROS,    Pulmonary exam normal breath sounds clear to auscultation       Cardiovascular hypertension, Normal cardiovascular exam Rhythm:Regular Rate:Normal     Neuro/Psych negative neurological ROS  negative psych ROS   GI/Hepatic negative GI ROS, Neg liver ROS,   Endo/Other  negative endocrine ROSdiabetes  Renal/GU negative Renal ROS     Musculoskeletal   Abdominal (+) + obese,   Peds  Hematology negative hematology ROS (+)   Anesthesia Other Findings   Reproductive/Obstetrics (+) Pregnancy                            Anesthesia Physical  Anesthesia Plan  ASA: 2  Anesthesia Plan: Spinal   Post-op Pain Management:    Induction:   PONV Risk Score and Plan: 3 and Ondansetron, Dexamethasone, Treatment may vary due to age or medical condition and Scopolamine patch - Pre-op  Airway Management Planned: Natural Airway  Additional Equipment: None  Intra-op Plan:   Post-operative Plan:   Informed Consent: I have reviewed the patients History and Physical, chart, labs and discussed the procedure including the risks, benefits and alternatives for the proposed anesthesia with the patient or authorized representative who has indicated his/her understanding and acceptance.     Dental advisory given  Plan Discussed with: CRNA  Anesthesia Plan Comments:        Anesthesia Quick Evaluation

## 2021-03-05 NOTE — Op Note (Signed)
Toni Morgan   PROCEDURE DATE: 03/05/2021  PREOPERATIVE DIAGNOSIS: Intrauterine pregnancy at [redacted]w[redacted]d, current shortended cervix, history of cervical incompetence   POSTOPERATIVE DIAGNOSIS: The same PROCEDURE: Transvaginal Rescue McDonald Cervical Cerclage Placement SURGEON:  Dr. Jaynie Collins  INDICATIONS: 36 y.o. A1P3790 at [redacted]w[redacted]d with recently diagnosed shortened cervix and history of cervical incompetence, here for cerclage placement.   The risks of surgery were discussed in detail with the patient including but not limited to: bleeding; infection which may require antibiotic therapy; injury to cervix, vagina other surrounding organs; risk of ruptured membranes and/or preterm delivery and other postoperative or anesthesia complications.  Written informed consent was obtained.    FINDINGS:  About 1 cm palpable cervical length in the vagina, closed cervix, suture knot placed anteriorly.  ANESTHESIA:  Spinal ESTIMATED BLOOD LOSS: 20 ml COMPLICATIONS: None immediate  PROCEDURE IN DETAIL:  The patient received intravenous antibiotics and had sequential compression devices applied to her lower extremities while in the preoperative area.  Reassuring fetal heart rate was also obtained using a doppler. She was then taken to the operating room where spinal anesthesia was administered and was found to be adequate.  She was placed in the dorsal lithotomy and Trendelenburg position, and was prepped and draped in a sterile manner. A foley catheter was placed in her bladder and she had clear, yellow urine.  The bladder was then backfilled with 300 ml of normal saline.  After an adequate timeout was performed, a vaginal speculum was then placed in the patient's vagina.  The anterior and posterior lips of the cervix were grasped with ring forceps. A curved needle with a 1-0 Prolene suture was inserted at 12 o'clock, as high as possible at the junction of the rugated vaginal epithelium and the smooth cervix, at least 2  cm above the external os.  Four bites are taken circumferentially around the entire cervix in a purse-string fashion, with care given not to get into the endocervical canal. The two ends of the suture were then tied securely anteriorly and cut, leaving the ends long enough to grasp with a clamp when it is time to remove it. There was minimal bleeding noted and the ring forceps were removed with good hemostasis noted.  No immediate complications noted.  All instruments were removed from the patient's vagina.  Indomethacin 100 mg rectal suppository was placed.  Instrument, needle and sponge counts were correct x 2. The patient tolerated the procedure well, and was taken to the recovery area awake and in stable condition.  Reassuring fetal heart rate was also obtained using a doppler in the recovery area.  The patient will be discharged to home as per PACU criteria.  Routine postoperative instructions given.  She was prescribed Percocet and Colace.  She will follow up in the office on 03/18/2021 for repeat MFM scan and on 03/21/2021 for ongoing prenatal care.   Jaynie Collins, MD, FACOG Obstetrician & Gynecologist, [facility] for [employer], [facility]

## 2021-03-05 NOTE — Anesthesia Procedure Notes (Addendum)
Spinal  Patient location during procedure: OR Start time: 03/05/2021 1:19 PM End time: 03/05/2021 1:24 PM Reason for block: surgical anesthesia Staffing Performed: anesthesiologist  Anesthesiologist: Germeroth, John, MD Preanesthetic Checklist Completed: patient identified, IV checked, site marked, risks and benefits discussed, surgical consent, monitors and equipment checked, pre-op evaluation and timeout performed Spinal Block Patient position: sitting Prep: DuraPrep and site prepped and draped Patient monitoring: heart rate, continuous pulse ox and blood pressure Approach: midline Location: L3-4 Injection technique: single-shot Needle Needle type: Spinocan  Needle gauge: 25 G Needle length: 9 cm Additional Notes Expiration date of kit checked and confirmed. Patient tolerated procedure well, without complications.    

## 2021-03-06 NOTE — Anesthesia Postprocedure Evaluation (Signed)
Anesthesia Post Note  Patient: Toni Morgan  Procedure(s) Performed: CERCLAGE CERVICAL     Patient location during evaluation: PACU Anesthesia Type: Spinal Level of consciousness: awake and alert Pain management: pain level controlled Vital Signs Assessment: post-procedure vital signs reviewed and stable Respiratory status: spontaneous breathing Cardiovascular status: stable Postop Assessment: spinal receding Anesthetic complications: no   No notable events documented.  Last Vitals:  Vitals:   03/05/21 1545 03/05/21 1600  BP: 118/83 118/81  Pulse: 75 82  Resp: 15 20  Temp:    SpO2: 100% 98%    Last Pain:  Vitals:   03/05/21 1545  TempSrc:   PainSc: 1                  Lewie Loron

## 2021-03-07 ENCOUNTER — Encounter (HOSPITAL_COMMUNITY): Payer: Self-pay | Admitting: Obstetrics & Gynecology

## 2021-03-07 NOTE — Transfer of Care (Signed)
Immediate Anesthesia Transfer of Care Note  Patient: Toni Morgan  Procedure(s) Performed: CERCLAGE CERVICAL  Patient Location: PACU  Anesthesia Type:Spinal  Level of Consciousness: awake, alert  and oriented  Airway & Oxygen Therapy: Patient Spontanous Breathing  Post-op Assessment: Report given to RN and Post -op Vital signs reviewed and stable  Post vital signs: Reviewed and stable  Last Vitals:  Vitals Value Taken Time  BP 118/81 03/05/21 1600  Temp 37.4 C 03/05/21 1410  Pulse 99 03/05/21 1613  Resp 17 03/05/21 1613  SpO2 99 % 03/05/21 1613  Vitals shown include unvalidated device data.  Last Pain:  Vitals:   03/05/21 1545  TempSrc:   PainSc: 1          Complications: No notable events documented.

## 2021-03-08 NOTE — Progress Notes (Signed)
Patient was seen on 03/03/21 for follow-up assessment and education for Gestational Diabetes. EDD 07/27/21; [redacted]w[redacted]d.   Patient is testing blood glucose as directed pre breakfast and 2 hours after each meal. Review of Blood Glucose App  FBS: 96, 102, 99 mg/dL PPBG: 826-415 mg/dL  Pt states she has co-pay $30 lancets/$52 strips. Pt has medicaid, not sure why the high co-pay.  Pt states she uses exercise bike 20 mn per day Pt meals appear to be appropriate carb intake  The following learning objectives reviewed during follow-up visit:  Strategies for lowering fasting blood sugar Eating balanced meals  Plan:  Exercise in the evening and increase intensity Continue eating well balanced diet    Patient instructed to monitor glucose levels: FBS: 60 - 95 mg/dl 2 hour: <830 mg/dl  Patient received the following handouts: none  Patient will be seen for follow-up in as needed.

## 2021-03-09 ENCOUNTER — Ambulatory Visit: Payer: Managed Care, Other (non HMO) | Admitting: Skilled Nursing Facility1

## 2021-03-09 ENCOUNTER — Other Ambulatory Visit: Payer: Self-pay | Admitting: Pharmacist

## 2021-03-09 ENCOUNTER — Telehealth: Payer: Self-pay

## 2021-03-09 NOTE — Telephone Encounter (Signed)
Pt left VM on nurse line requesting a call back to confirm when she is able to return to work following cerclage.

## 2021-03-10 NOTE — Telephone Encounter (Signed)
Reviewed with Alvester Morin, MD in office who states pt may return to work at any time. Anyanwu, MD sent routing message stating pt may remain out of work for 1 week. Called pt, pt would like note stating she may return to work after 1 week. Note written to reflect 1 week from date of procedure.

## 2021-03-15 ENCOUNTER — Encounter (HOSPITAL_COMMUNITY): Payer: Self-pay | Admitting: Obstetrics & Gynecology

## 2021-03-15 NOTE — Addendum Note (Signed)
Addendum  created 03/15/21 1956 by Lewie Loron, MD   Intraprocedure Event edited, Intraprocedure Meds edited, Intraprocedure Staff edited

## 2021-03-18 ENCOUNTER — Other Ambulatory Visit: Payer: Self-pay

## 2021-03-18 ENCOUNTER — Ambulatory Visit: Payer: Managed Care, Other (non HMO) | Admitting: *Deleted

## 2021-03-18 ENCOUNTER — Encounter: Payer: Self-pay | Admitting: *Deleted

## 2021-03-18 ENCOUNTER — Ambulatory Visit: Payer: Managed Care, Other (non HMO) | Attending: Obstetrics and Gynecology

## 2021-03-18 VITALS — BP 124/68 | HR 98

## 2021-03-18 DIAGNOSIS — O3432 Maternal care for cervical incompetence, second trimester: Secondary | ICD-10-CM | POA: Diagnosis not present

## 2021-03-18 DIAGNOSIS — E119 Type 2 diabetes mellitus without complications: Secondary | ICD-10-CM | POA: Diagnosis not present

## 2021-03-18 DIAGNOSIS — O24112 Pre-existing diabetes mellitus, type 2, in pregnancy, second trimester: Secondary | ICD-10-CM

## 2021-03-18 DIAGNOSIS — O99212 Obesity complicating pregnancy, second trimester: Secondary | ICD-10-CM | POA: Diagnosis not present

## 2021-03-18 DIAGNOSIS — E669 Obesity, unspecified: Secondary | ICD-10-CM

## 2021-03-18 DIAGNOSIS — O34219 Maternal care for unspecified type scar from previous cesarean delivery: Secondary | ICD-10-CM

## 2021-03-18 DIAGNOSIS — Z3A21 21 weeks gestation of pregnancy: Secondary | ICD-10-CM

## 2021-03-21 ENCOUNTER — Encounter: Payer: Managed Care, Other (non HMO) | Admitting: Obstetrics and Gynecology

## 2021-03-24 ENCOUNTER — Encounter: Payer: Managed Care, Other (non HMO) | Admitting: Family Medicine

## 2021-03-28 ENCOUNTER — Ambulatory Visit (INDEPENDENT_AMBULATORY_CARE_PROVIDER_SITE_OTHER): Payer: Managed Care, Other (non HMO) | Admitting: Obstetrics & Gynecology

## 2021-03-28 ENCOUNTER — Other Ambulatory Visit: Payer: Self-pay

## 2021-03-28 VITALS — BP 132/80 | HR 103 | Wt 215.6 lb

## 2021-03-28 DIAGNOSIS — O26872 Cervical shortening, second trimester: Secondary | ICD-10-CM

## 2021-03-28 DIAGNOSIS — O24319 Unspecified pre-existing diabetes mellitus in pregnancy, unspecified trimester: Secondary | ICD-10-CM

## 2021-03-28 DIAGNOSIS — O09299 Supervision of pregnancy with other poor reproductive or obstetric history, unspecified trimester: Secondary | ICD-10-CM

## 2021-03-28 MED ORDER — INSULIN NPH (HUMAN) (ISOPHANE) 100 UNIT/ML ~~LOC~~ SUSP: SUBCUTANEOUS | 3 refills | Status: DC

## 2021-03-28 NOTE — Progress Notes (Signed)
   PRENATAL VISIT NOTE  Subjective:  Toni Morgan is a 35 y.o. [phone] at [redacted]w[redacted]d being seen today for ongoing prenatal care.  She is currently monitored for the following issues for this high-risk pregnancy and has Supervision of high risk pregnancy, antepartum; Obesity (BMI 30.0-34.9); Pre-existing diabetes mellitus during pregnancy, antepartum; History of cesarean delivery; Short cervical length during pregnancy, second trimester; History of cervical incompetence in pregnancy, currently pregnant; [redacted] weeks gestation of pregnancy; and Short cervix during pregnancy in second trimester on their problem list.  Patient reports backache.  Contractions: Not present. Vag. Bleeding: None.  Movement: Present. Denies leaking of fluid.   The following portions of the patient's history were reviewed and updated as appropriate: allergies, current medications, past family history, past medical history, past social history, past surgical history and problem list.   Objective:   Vitals:   03/28/21 0928  BP: 132/80  Pulse: (!) 103  Weight: 215 lb 9.6 oz (97.8 kg)    Fetal Status: Fetal Heart Rate (bpm): 148   Movement: Present     General:  Alert, oriented and cooperative. Patient is in no acute distress.  Skin: Skin is warm and dry. No rash noted.   Cardiovascular: Normal heart rate noted  Respiratory: Normal respiratory effort, no problems with respiration noted  Abdomen: Soft, gravid, appropriate for gestational age.  Pain/Pressure: Present     Pelvic: Cervical exam deferred        Extremities: Normal range of motion.  Edema: None  Mental Status: Normal mood and affect. Normal behavior. Normal judgment and thought content.   Assessment and Plan:  Pregnancy: G4P1021 at [redacted]w[redacted]d 1. Short cervical length during pregnancy, second trimester Cerclage in place, [place] f/U in 4 days  2. History of cervical incompetence in pregnancy, currently pregnant   3. Pre-existing diabetes mellitus during pregnancy,  antepartum FBS 100, has nausea with metformin, change to insulin, try a low dose of NPH to start, needs education - insulin NPH Human (NOVOLIN N) 100 UNIT/ML injection; 10 units [place] ac breakfast and 6 units HS  Dispense: 10 mL; Refill: 3  Preterm labor symptoms and general obstetric precautions including but not limited to vaginal bleeding, contractions, leaking of fluid and fetal movement were reviewed in detail with the patient. Please refer to After Visit Summary for other counseling recommendations.   Return in about 2 weeks (around 04/11/2021).  Future Appointments  Date Time Provider Department Center  04/01/2021  9:15 AM [facility] NURSE [facility]  04/01/2021  9:30 AM [facility] US3 [facility]  04/13/2021  3:35 PM Burleson, Brand Males, NP [facility]    Scheryl Darter, MD

## 2021-03-28 NOTE — Patient Instructions (Signed)
Insulin Treatment for Diabetes Mellitus Diabetes, also known as diabetes mellitus, is a long-term (chronic) disease. It occurs when the body does not properly use sugar (glucose). Glucose levels are controlled by a hormone called insulin. Insulin is made in the pancreas, which is an organ behind the stomach. In type 1 diabetes, the pancreas does not make insulin. In type 2 diabetes, the body does not use or respond to insulin properly (called insulin resistance). Also, in some people, the pancreas does not make enough insulin. Treatment plans for diabetes vary. They are unique for each person. Treatment plans depend on: The type of diabetes. The treatment goals. Your medical history. People with type 1 diabetes and some with type 2 diabetes will need to take insulin as part of their treatment plan. Ask questions to understand yourinsulin treatment so you can be active in managing your diabetes. Types of insulin Insulin molecules are delicate. Insulin is destroyed by enzymes in the stomach or intestine. For this reason, insulin is not given in pill form. It is eitherinjected under the skin or inhaled into the lungs. You may use more than one type of insulin. The different types of insulin are described below. It is important to know the onset, peak, and duration of the type of insulin you take. The onset is when it starts lowering blood glucose.The peak is when it works the strongest. The duration is how long it works. Insulin comes in different strengths. The most common strength is U-100, or 100 units per 1 mL of insulin. It is important to make sure you are using the rightstrength of insulin with the right syringe. Rapid-acting insulin: Onset: Within 15 minutes. Peak: About 1-2 hours. Duration: 2-4 hours. Works well when taken right before a meal to quickly lower your blood glucose. This type of insulin is available as an injection and an inhaler. You and your health care provider will decide if  an injection or inhaler is best for you. Short-acting insulin: Onset: Within 30 minutes. Peak: 2-3 hours. Duration: 3-6 hours. Should be taken about 30 minutes before you start eating a meal. Intermediate-acting insulin: Onset: Within 1.5-4 hours. Peak: 4-12 hours. Duration: 12-18 hours. Lowers your blood glucose for a longer period of time. However, it does not work as well for lowering blood glucose right after a meal. Usually used 1-2 times per day. Long-acting insulin: Mimics the small amount of insulin that your pancreas usually makes throughout the day. Onset: Within 2 hours. Peak: There is no peak. Long-acting insulins lower blood glucose levels evenly throughout the day. Duration: At least 24 hours. Long-acting insulins should be used one or two times a day. Concentrated insulin: Concentrated insulins are available in higher concentrations than U-100 insulins. Concentrated insulins are helpful for people who require high doses of insulin, usually more than 100 units per day. Concentrated insulins deliver the same amount of insulin but in a smaller volume. Concentrated insulins are available as: Humulin (Regular insulin U-500) has 500 units per 1 mL of insulin. This insulin should only be used only with the U-500 syringe or U-500 insulin pen. Do not use another type of syringe with this insulin. The wrong type of syringe can cause serious problems such as low blood glucose. Humalog (Insulin Lispro, U-200) and Tresiba (Insulin degludec, U-200) have 200 units per 1 mL of insulin. These insulins are only available as a U-200 pen. Toujeo (Insulin glargine, U-300) has 300 units per 1 mL of insulin. This insulin is only available as a  only available as a U-300 pen. Common terms related to insulin treatment: Some terms that you might hear include: Basal insulin or background insulin This is the constant amount of insulin that keeps your blood glucose levels stable when you are not eating. People who have  type 1 diabetes need basal insulin in a nonstop (continuous) or steady dose 24 hours a day. People with type 2 diabetes may also get basal insulin. Usually, intermediate-acting or long-acting insulin is used one or two times a day to manage glucose levels. Bolus insulin This refers to meal-related insulin (prandial insulin). Blood glucose rises quickly after a meal (postprandial). Rapid-acting or short-acting insulin can be used before a meal (preprandial) to help control blood glucose after the meal. You may be told to adjust the amount of bolus insulin that you take based on how much carbohydrate is in your meal. Correction insulin This may also be called a correction dose or supplemental dose. This is a small amount of rapid-acting or short-acting insulin that can be used to lower your blood glucose if it is too high. You may be told to check your blood glucose at certain times of the day and use correction insulin as needed. Tight control, intensive therapy, or basal-bolus insulin therapy These terms are used for insulin plans that keep your blood glucose as close to your target as possible. They prevent your blood glucose from getting too high at any time of day, but especially after meals. People who have tight control of their diabetes have fewer long-term problems caused by diabetes. Insulins are also available as mixtures of basal and bolus insulins. Using a premixed insulin decreases the number of injections you might need everyday. Talk to your health care provider to see if an insulin mixture is right for you. What are the risks? Possible side effects of insulin treatment include: Low blood glucose (hypoglycemia). Weight gain. Bruising or irritation at the injection site. Some of these side effects can be caused by incorrect insulin doses and improper injection technique. Be sure to learn how to inject insulin properly. Supplies needed: Soap and water. Insulin. Insulin syringe or  insulin pen needles. Alcohol wipes. Blood glucose meter. Blood glucose test strips and lancets. A disposal container for sharp items (sharps container), such as an empty plastic bottle with a cover. How to use insulin Most often, insulin is given through an injection. It is injected using a syringe and needle, an insulin pen, a pump, or a jet injector. Your health care provider will: Prescribe the type and amount of insulin that you need. Tell you when you should inject your insulin. Usually, you will give yourself insulin injections. Other people can also be taught how to give you injections. You will use a type of syringe that is made only for insulin. Some people may have an insulin pump that delivers insulin steadily through a tube (cannula) that is placed under the skin. Insulin is also available in an inhaled form. An inhaler delivers bolus insulin doses. Inhaled insulin does not require injections, but most people will still need to use basal insulin that is injected. Injection sites Insulin is injected into a layer of fatty tissue under your skin. Good places to inject insulin include: Abdomen. Generally, the abdomen is the best place to inject insulin. However, you should avoid any area that is less than 2 inches (5 cm) from your belly button. Front of thigh. Upper, outer side of thigh. Upper, outer side of arm. Upper, outer part   of buttock. It is important to: Give your injection in a slightly different place each time. This helps to prevent irritation and improve absorption. Avoid injecting into areas that have scar tissue. Follow these instructions at home: Eating and drinking Talk with your health care provider or pharmacist about the type of insulin you should take and when you should take it. You should know when your insulin peaksand how long it lasts. You need this information to plan mealtimes and exercise. Follow instructions from your health care provider about a healthy  meal plan. Do not skip meals. Drink enough fluid to keep your urine pale yellow. Follow your sick day plan whenever you cannot eat or drink normally. Make this plan in advance with your health care provider. Lifestyle Work with your health care provider to manage your weight, blood pressure, cholesterol, and stress. Exercise regularly. Avoid drinking alcohol. Do not use any products that contain nicotine or tobacco. These products include cigarettes, chewing tobacco, and vaping devices, such as e-cigarettes. If you need help quitting, ask your health care provider. General instructions  Check your blood glucose as told. Your health care provider will tell you how often and when you should check your blood glucose. Your health care provider will also tell you what your glucose levels should be. Make sure to check your blood glucose before and after you exercise. If you exercise longer or in a different way than usual, check your blood glucose more often. Make sure you know the symptoms of high and low blood sugar and how to treat these. Take over-the-counter and prescription medicines only as told by your health care provider. Carry a medical alert card or wear medical alert jewelry. Keep all follow-up visits. This is important. Summary Diabetes is a long-term disease. It occurs when the body does not properly use glucose. Glucose levels are controlled by a hormone called insulin. Insulin treatment varies depending on your type of diabetes, your treatment goals, and your medical history. Talk with your health care provider or pharmacist about the type of insulin you should take and when you should take it. Check your blood glucose as told by your health care provider. Your health care provider will tell you how often and when you should check your blood glucose, and what your glucose levels should be. Know the symptoms of high and low blood glucose and how to treat them. This information is not  intended to replace advice given to you by your health care provider. Make sure you discuss any questions you have with your health care provider. Document Revised: 06/23/2020 Document Reviewed: 06/23/2020 Elsevier Patient Education  2022 [employer].   

## 2021-03-28 NOTE — Progress Notes (Signed)
Pt states is having pain in lower abdomen & back.

## 2021-03-29 ENCOUNTER — Telehealth: Payer: Self-pay | Admitting: General Practice

## 2021-03-29 DIAGNOSIS — O24319 Unspecified pre-existing diabetes mellitus in pregnancy, unspecified trimester: Secondary | ICD-10-CM

## 2021-03-29 MED ORDER — INSULIN NPH (HUMAN) (ISOPHANE) 100 UNIT/ML ~~LOC~~ SUSP: SUBCUTANEOUS | 3 refills | Status: DC

## 2021-03-29 NOTE — Telephone Encounter (Signed)
Patient called and left message on nurse voicemail line stating the prescription that was sent in isn't covered by medicaid and she needs an alternative. Per chart review, novolin n was prescribed.  Called patient, no answer- left message stating we are trying to reach her to return her phone call, she may call us back or check her mychart account for a message. Changed Rx to humulin n which is covered by medicaid.

## 2021-03-31 ENCOUNTER — Ambulatory Visit: Payer: Managed Care, Other (non HMO) | Admitting: Registered"

## 2021-03-31 ENCOUNTER — Other Ambulatory Visit: Payer: Self-pay

## 2021-03-31 ENCOUNTER — Other Ambulatory Visit: Payer: Self-pay | Admitting: Lactation Services

## 2021-03-31 ENCOUNTER — Encounter: Payer: Managed Care, Other (non HMO) | Attending: Obstetrics and Gynecology | Admitting: Registered"

## 2021-03-31 DIAGNOSIS — E119 Type 2 diabetes mellitus without complications: Secondary | ICD-10-CM | POA: Diagnosis present

## 2021-03-31 DIAGNOSIS — O24319 Unspecified pre-existing diabetes mellitus in pregnancy, unspecified trimester: Secondary | ICD-10-CM

## 2021-03-31 MED ORDER — "INSULIN SYRINGE-NEEDLE U-100 30G X 15/64"" 0.5 ML MISC": 1.0000 | Freq: Four times a day (QID) | 6 refills | Status: DC

## 2021-03-31 MED ORDER — DEXCOM G6 TRANSMITTER MISC: 1.0000 | 1 refills | Status: DC

## 2021-03-31 MED ORDER — DEXCOM G6 SENSOR MISC: 1.0000 | 6 refills | Status: AC

## 2021-03-31 NOTE — Progress Notes (Signed)
Dexcom Sensors and Transmitters ordered per Heywood Bene, RD

## 2021-03-31 NOTE — Progress Notes (Signed)
Insulin Instruction  Patient was seen on 03/31/21 for insulin instruction.   MD orders are: NPH 10 units breakfast; 6 units HS  The following learning objectives were met by the patient during this visit:   Insulin Action of different types of insulins  Reviewed syringe & vial including # units per syringe and vial  Hygiene and storage  Drawing up single and mixed doses if using vials  Rotation of Sites  Hypoglycemia- symptoms, causes, treatment choices  Record keeping and MD follow up  Also discussed CGM & Omnipod insulin delivery options  Patient demonstrated understanding of insulin administration by return demonstration.  Patient received the following handouts: Insulin Instruction Handout Pharmacokinetics of insulin                                        Patient to start on insulin as Rx'd by MD  Patient will be seen for follow-up in 2 weeks or as needed.

## 2021-04-01 ENCOUNTER — Other Ambulatory Visit: Payer: Self-pay | Admitting: *Deleted

## 2021-04-01 ENCOUNTER — Ambulatory Visit: Payer: Managed Care, Other (non HMO) | Admitting: *Deleted

## 2021-04-01 ENCOUNTER — Ambulatory Visit: Payer: Managed Care, Other (non HMO) | Attending: Obstetrics and Gynecology

## 2021-04-01 VITALS — BP 125/77 | HR 103

## 2021-04-01 DIAGNOSIS — E119 Type 2 diabetes mellitus without complications: Secondary | ICD-10-CM | POA: Diagnosis not present

## 2021-04-01 DIAGNOSIS — Z362 Encounter for other antenatal screening follow-up: Secondary | ICD-10-CM

## 2021-04-01 DIAGNOSIS — O24112 Pre-existing diabetes mellitus, type 2, in pregnancy, second trimester: Secondary | ICD-10-CM | POA: Diagnosis not present

## 2021-04-01 DIAGNOSIS — O99212 Obesity complicating pregnancy, second trimester: Secondary | ICD-10-CM

## 2021-04-01 DIAGNOSIS — O3430 Maternal care for cervical incompetence, unspecified trimester: Secondary | ICD-10-CM

## 2021-04-01 DIAGNOSIS — O24119 Pre-existing diabetes mellitus, type 2, in pregnancy, unspecified trimester: Secondary | ICD-10-CM

## 2021-04-01 DIAGNOSIS — O34219 Maternal care for unspecified type scar from previous cesarean delivery: Secondary | ICD-10-CM

## 2021-04-01 DIAGNOSIS — Z794 Long term (current) use of insulin: Secondary | ICD-10-CM

## 2021-04-01 DIAGNOSIS — O3432 Maternal care for cervical incompetence, second trimester: Secondary | ICD-10-CM | POA: Diagnosis present

## 2021-04-01 DIAGNOSIS — E669 Obesity, unspecified: Secondary | ICD-10-CM

## 2021-04-01 DIAGNOSIS — Z3A23 23 weeks gestation of pregnancy: Secondary | ICD-10-CM

## 2021-04-04 ENCOUNTER — Telehealth: Payer: Self-pay

## 2021-04-04 NOTE — Telephone Encounter (Signed)
Pt called Nurse line stating that the Insulin Syringes that were Rx are on back order at her Pharmacy & that she called another [facility] and was told the same thing. Pt called today at 9:13am.

## 2021-04-04 NOTE — Telephone Encounter (Signed)
Regarding Pt's Nurse call, I called [facility] on [address] to see if they had the Insulin Syringes and they said the same thing, on back order. Spoke with Md. Vergie Living , advised to call orig Pharmacy back to see if they have substitutes. So called her pharmacy on file & they advised me that Pt already ordered a different brand that is due to arrive today for pickup. So called Pt to verify and she said yes, that was correct.She was just checking.

## 2021-04-13 ENCOUNTER — Other Ambulatory Visit: Payer: Self-pay

## 2021-04-13 ENCOUNTER — Ambulatory Visit (INDEPENDENT_AMBULATORY_CARE_PROVIDER_SITE_OTHER): Payer: Managed Care, Other (non HMO) | Admitting: Nurse Practitioner

## 2021-04-13 VITALS — BP 137/86 | HR 108 | Wt 218.5 lb

## 2021-04-13 DIAGNOSIS — Z3A25 25 weeks gestation of pregnancy: Secondary | ICD-10-CM

## 2021-04-13 DIAGNOSIS — O0990 Supervision of high risk pregnancy, unspecified, unspecified trimester: Secondary | ICD-10-CM

## 2021-04-13 DIAGNOSIS — O09299 Supervision of pregnancy with other poor reproductive or obstetric history, unspecified trimester: Secondary | ICD-10-CM

## 2021-04-13 DIAGNOSIS — O24319 Unspecified pre-existing diabetes mellitus in pregnancy, unspecified trimester: Secondary | ICD-10-CM

## 2021-04-13 DIAGNOSIS — Z98891 History of uterine scar from previous surgery: Secondary | ICD-10-CM

## 2021-04-13 NOTE — Progress Notes (Addendum)
    Subjective:  Toni Morgan is a 35 y.o. [phone] at [redacted]w[redacted]d being seen today for ongoing prenatal care.  She is currently monitored for the following issues for this high-risk pregnancy and has Supervision of high risk pregnancy, antepartum; Obesity (BMI 30.0-34.9); Pre-existing diabetes mellitus during pregnancy, antepartum; History of cesarean delivery; Short cervical length during pregnancy, second trimester; History of cervical incompetence in pregnancy, currently pregnant; [redacted] weeks gestation of pregnancy; and Short cervix during pregnancy in second trimester on their problem list.  Patient reports  some pain when walking after walking or lying down for a period of time.. No bleeding and baby is moving well .  Contractions: Not present. Vag. Bleeding: None.  Movement: Present. Denies leaking of fluid.   The following portions of the patient's history were reviewed and updated as appropriate: allergies, current medications, past family history, past medical history, past social history, past surgical history and problem list. Problem list updated.  Objective:   Vitals:   04/13/21 1558  BP: 137/86  Pulse: (!) 108  Weight: 218 lb 8 oz (99.1 kg)    Fetal Status: Fetal Heart Rate (bpm): 154   Movement: Present     General:  Alert, oriented and cooperative. Patient is in no acute distress.  Skin: Skin is warm and dry. No rash noted.   Cardiovascular: Normal heart rate noted  Respiratory: Normal respiratory effort, no problems with respiration noted  Abdomen: Soft, gravid, appropriate for gestational age. Pain/Pressure: Present     Pelvic:  Cervical exam deferred        Extremities: Normal range of motion.  Edema: None  Mental Status: Normal mood and affect. Normal behavior. Normal judgment and thought content.   Urinalysis:      Assessment and Plan:  Pregnancy: G4P1021 at [redacted]w[redacted]d  1. Supervision of high risk pregnancy, antepartum Will need 28 week labs at next visit EXCEPT NO glucola  testing needed.  2. Pre-existing diabetes mellitus during pregnancy, antepartum Now on insulin as she could not tolerate metformin due to vomiting Has list of glucose readings since August 1 Consult with Dr. Crissie Reese - most readings above target measures. Will increase insult was at 10 units at breakfast and 6 units at night - new level is 12 units at breakfast and 10 units at night to be able to better meet target range. Advised to call clinic if feeling bad. Return in one week to see MD and bring glucose levels.   3. History of cervical incompetence in pregnancy, currently pregnant Has next [place] on 04-25-21 Has pelvic rest - nothing in the vagina Funneling noted on [place] to cerclage stitch  4. History of cesarean delivery   5. [redacted] weeks gestation of pregnancy   Preterm labor symptoms and general obstetric precautions including but not limited to vaginal bleeding, contractions, leaking of fluid and fetal movement were reviewed in detail with the patient. Please refer to After Visit Summary for other counseling recommendations.  Return in about 1 week (around 04/20/2021) for [facility] - will need to see MD for delivery plan with cerclage.  Nolene Bernheim, RN, MSN, NP-BC Nurse Practitioner, [facility] [employer], [facility] 04/13/2021 4:49 PM

## 2021-04-13 NOTE — Progress Notes (Signed)
Patient complains of pain when walking or laying down for long periods of time, denies any vaginal bleeding and feels baby move everyday/multiple times a day

## 2021-04-13 NOTE — Patient Instructions (Signed)
12 units at breakfast and 10 units at night

## 2021-04-20 ENCOUNTER — Other Ambulatory Visit: Payer: Self-pay

## 2021-04-20 ENCOUNTER — Ambulatory Visit (INDEPENDENT_AMBULATORY_CARE_PROVIDER_SITE_OTHER): Payer: Managed Care, Other (non HMO) | Admitting: Family Medicine

## 2021-04-20 ENCOUNTER — Encounter: Payer: Self-pay | Admitting: Family Medicine

## 2021-04-20 VITALS — BP 122/76 | HR 100 | Wt 216.3 lb

## 2021-04-20 DIAGNOSIS — Z23 Encounter for immunization: Secondary | ICD-10-CM | POA: Diagnosis not present

## 2021-04-20 DIAGNOSIS — O09299 Supervision of pregnancy with other poor reproductive or obstetric history, unspecified trimester: Secondary | ICD-10-CM

## 2021-04-20 DIAGNOSIS — O24319 Unspecified pre-existing diabetes mellitus in pregnancy, unspecified trimester: Secondary | ICD-10-CM

## 2021-04-20 DIAGNOSIS — Z98891 History of uterine scar from previous surgery: Secondary | ICD-10-CM

## 2021-04-20 DIAGNOSIS — O0990 Supervision of high risk pregnancy, unspecified, unspecified trimester: Secondary | ICD-10-CM

## 2021-04-20 DIAGNOSIS — Z3A26 26 weeks gestation of pregnancy: Secondary | ICD-10-CM

## 2021-04-20 NOTE — Progress Notes (Signed)
  Glucose readings in Phone.

## 2021-04-20 NOTE — Progress Notes (Signed)
   Subjective:  Toni Morgan is a 35 y.o. [phone] at [redacted]w[redacted]d being seen today for ongoing prenatal care.  She is currently monitored for the following issues for this high-risk pregnancy and has Supervision of high risk pregnancy, antepartum; Obesity (BMI 30.0-34.9); Pre-existing diabetes mellitus during pregnancy, antepartum; History of cesarean delivery; Short cervical length during pregnancy, second trimester; History of cervical incompetence in pregnancy, currently pregnant; [redacted] weeks gestation of pregnancy; and Short cervix during pregnancy in second trimester on their problem list.  Patient reports no complaints.  Contractions: Not present. Vag. Bleeding: None.  Movement: Present. Denies leaking of fluid.   The following portions of the patient's history were reviewed and updated as appropriate: allergies, current medications, past family history, past medical history, past social history, past surgical history and problem list. Problem list updated.  Objective:   Vitals:   04/20/21 1637  BP: 122/76  Pulse: 100  Weight: 216 lb 4.8 oz (98.1 kg)    Fetal Status: Fetal Heart Rate (bpm): 155   Movement: Present     General:  Alert, oriented and cooperative. Patient is in no acute distress.  Skin: Skin is warm and dry. No rash noted.   Cardiovascular: Normal heart rate noted  Respiratory: Normal respiratory effort, no problems with respiration noted  Abdomen: Soft, gravid, appropriate for gestational age. Pain/Pressure: Present     Pelvic: Vag. Bleeding: None     Cervical exam deferred        Extremities: Normal range of motion.  Edema: Trace  Mental Status: Normal mood and affect. Normal behavior. Normal judgment and thought content.   Urinalysis:      Assessment and Plan:  Pregnancy: G4P1021 at [redacted]w[redacted]d  1. Supervision of high risk pregnancy, antepartum BP and FHR normal - Tdap vaccine greater than or equal to 7yo IM  2. Pre-existing diabetes mellitus during pregnancy,  antepartum Increased to 12u qAM and 10u qPM of NPH last week, significant improvent Majority of sugars now at goal, patient very ahppy about this Following w MFM for growth scans Antenatal testing at 32 weeks Has fetal echo scheduled for 05/04/21 Having issues getting dexcom from pharmacy, will message Marylene Land and [place] to help with situation  3. History of cesarean delivery Sign VBAC form next visit  4. History of cervical incompetence in pregnancy, currently pregnant Cerclage in place Taking vaginal progesterone No recent contractions  Preterm labor symptoms and general obstetric precautions including but not limited to vaginal bleeding, contractions, leaking of fluid and fetal movement were reviewed in detail with the patient. Please refer to After Visit Summary for other counseling recommendations.  Return in 2 weeks (on 05/04/2021) for [facility], ob visit, needs MD.   Venora Maples, MD

## 2021-04-20 NOTE — Patient Instructions (Signed)

## 2021-04-25 ENCOUNTER — Other Ambulatory Visit: Payer: Self-pay

## 2021-04-25 ENCOUNTER — Ambulatory Visit: Payer: Managed Care, Other (non HMO) | Attending: Obstetrics

## 2021-04-25 ENCOUNTER — Other Ambulatory Visit: Payer: Self-pay | Admitting: *Deleted

## 2021-04-25 ENCOUNTER — Ambulatory Visit: Payer: Managed Care, Other (non HMO) | Admitting: *Deleted

## 2021-04-25 VITALS — BP 113/62 | HR 91

## 2021-04-25 DIAGNOSIS — E669 Obesity, unspecified: Secondary | ICD-10-CM

## 2021-04-25 DIAGNOSIS — O3432 Maternal care for cervical incompetence, second trimester: Secondary | ICD-10-CM

## 2021-04-25 DIAGNOSIS — Z362 Encounter for other antenatal screening follow-up: Secondary | ICD-10-CM | POA: Diagnosis not present

## 2021-04-25 DIAGNOSIS — O24112 Pre-existing diabetes mellitus, type 2, in pregnancy, second trimester: Secondary | ICD-10-CM

## 2021-04-25 DIAGNOSIS — O99212 Obesity complicating pregnancy, second trimester: Secondary | ICD-10-CM

## 2021-04-25 DIAGNOSIS — Z3A26 26 weeks gestation of pregnancy: Secondary | ICD-10-CM

## 2021-04-25 DIAGNOSIS — O3430 Maternal care for cervical incompetence, unspecified trimester: Secondary | ICD-10-CM | POA: Insufficient documentation

## 2021-04-25 DIAGNOSIS — E119 Type 2 diabetes mellitus without complications: Secondary | ICD-10-CM

## 2021-04-25 DIAGNOSIS — O34219 Maternal care for unspecified type scar from previous cesarean delivery: Secondary | ICD-10-CM

## 2021-04-28 ENCOUNTER — Other Ambulatory Visit: Payer: Self-pay

## 2021-05-05 ENCOUNTER — Encounter: Payer: Managed Care, Other (non HMO) | Admitting: Family Medicine

## 2021-05-06 ENCOUNTER — Ambulatory Visit (INDEPENDENT_AMBULATORY_CARE_PROVIDER_SITE_OTHER): Payer: Managed Care, Other (non HMO) | Admitting: Family

## 2021-05-06 ENCOUNTER — Other Ambulatory Visit: Payer: Self-pay

## 2021-05-06 VITALS — BP 121/76 | HR 100 | Wt 217.0 lb

## 2021-05-06 DIAGNOSIS — O0990 Supervision of high risk pregnancy, unspecified, unspecified trimester: Secondary | ICD-10-CM

## 2021-05-06 DIAGNOSIS — O26872 Cervical shortening, second trimester: Secondary | ICD-10-CM

## 2021-05-06 DIAGNOSIS — Z3A28 28 weeks gestation of pregnancy: Secondary | ICD-10-CM

## 2021-05-06 DIAGNOSIS — Z98891 History of uterine scar from previous surgery: Secondary | ICD-10-CM

## 2021-05-06 DIAGNOSIS — O24319 Unspecified pre-existing diabetes mellitus in pregnancy, unspecified trimester: Secondary | ICD-10-CM

## 2021-05-06 NOTE — Progress Notes (Signed)
Patient stated that she is feeling fine. 

## 2021-05-06 NOTE — Progress Notes (Signed)
   PRENATAL VISIT NOTE  Subjective:  Toni Morgan is a 35 y.o. G4P1021 at [redacted]w[redacted]d being seen today for ongoing prenatal care.  She is currently monitored for the following issues for this high-risk pregnancy and has Supervision of high risk pregnancy, antepartum; Obesity (BMI 30.0-34.9); Pre-existing diabetes mellitus during pregnancy, antepartum; History of cesarean delivery; Short cervical length during pregnancy, second trimester; History of cervical incompetence in pregnancy, currently pregnant; and [redacted] weeks gestation of pregnancy on their problem list.  Patient reports no complaints.  Contractions: Not present. Vag. Bleeding: None.  Movement: Present. Denies leaking of fluid. Excited that she is feeling well; Fetal echo completed this past Wed, reported told all normal, practice has not received the results yet.  Does not have glucose log, reports has had a couple of abnormal results after meals.  Will bring to next visit.  States taking 12 NPH in AM and 10 units in  PM  The following portions of the patient's history were reviewed and updated as appropriate: allergies, current medications, past family history, past medical history, past social history, past surgical history and problem list.   Objective:   Vitals:   05/06/21 0824  BP: 121/76  Pulse: 100  Weight: 217 lb (98.4 kg)    Fetal Status: Fetal Heart Rate (bpm): 150 Fundal Height: 29 cm Movement: Present     General:  Alert, oriented and cooperative. Patient is in no acute distress.  Skin: Skin is warm and dry. No rash noted.   Cardiovascular: Normal heart rate noted  Respiratory: Normal respiratory effort, no problems with respiration noted  Abdomen: Soft, gravid, appropriate for gestational age.  Pain/Pressure: Absent     Pelvic: Cervical exam deferred        Extremities: Normal range of motion.  Edema: Trace  Mental Status: Normal mood and affect. Normal behavior. Normal judgment and thought content.   Assessment and  Plan:  Pregnancy: G4P1021 at [redacted]w[redacted]d 1. Short cervical length during pregnancy, second trimester - Cerclage in place - Reviewed warning signs  2. [redacted] weeks gestation of pregnancy - HIV Antibody (routine testing w rflx) - RPR - CBC  3. Supervision of high risk pregnancy, antepartum - Continue insulin; stressed the importance of bringing glucose log; will bring to next visit  4. History of cesarean delivery - Reviewed TOLAC consent form and obtained signature  5. Pre-existing diabetes mellitus during pregnancy, antepartum - Bring log next visit - Fetal echo completed - Continue serial growth ultrasounds  Preterm labor symptoms and general obstetric precautions including but not limited to vaginal bleeding, contractions, leaking of fluid and fetal movement were reviewed in detail with the patient. Please refer to After Visit Summary for other counseling recommendations.   Return in about 2 weeks (around 05/20/2021) for MD Only.  Future Appointments  Date Time Provider Department Center  05/20/2021  7:30 AM [facility] NURSE [facility]  05/20/2021  7:45 AM [facility] US5 [facility]    [id number] Eloisa Northern, CNM

## 2021-05-07 LAB — HIV ANTIBODY (ROUTINE TESTING W REFLEX): HIV Screen 4th Generation wRfx: NONREACTIVE

## 2021-05-07 LAB — CBC
Hematocrit: 31.9 % — ABNORMAL LOW (ref 34.0–46.6)
Hemoglobin: 10.5 g/dL — ABNORMAL LOW (ref 11.1–15.9)
MCH: 27 pg (ref 26.6–33.0)
MCHC: 32.9 g/dL (ref 31.5–35.7)
MCV: 82 fL (ref 79–97)
Platelets: 251 10*3/uL (ref 150–450)
RBC: 3.89 x10E6/uL (ref 3.77–5.28)
RDW: 12.9 % (ref 11.7–15.4)
WBC: 9.3 10*3/uL (ref 3.4–10.8)

## 2021-05-07 LAB — RPR: RPR Ser Ql: NONREACTIVE

## 2021-05-10 ENCOUNTER — Telehealth: Payer: Self-pay

## 2021-05-10 NOTE — Telephone Encounter (Signed)
FETAL ECHO COMPLETED 05/04/2021.

## 2021-05-19 ENCOUNTER — Telehealth: Payer: Self-pay

## 2021-05-19 DIAGNOSIS — O24319 Unspecified pre-existing diabetes mellitus in pregnancy, unspecified trimester: Secondary | ICD-10-CM

## 2021-05-19 MED ORDER — INSULIN NPH (HUMAN) (ISOPHANE) 100 UNIT/ML ~~LOC~~ SUSP: SUBCUTANEOUS | 3 refills | Status: DC

## 2021-05-19 NOTE — Addendum Note (Signed)
Addended by: Isabell Jarvis on: 05/19/2021 11:34 AM   Modules accepted: Orders

## 2021-05-19 NOTE — Telephone Encounter (Signed)
VM left on nurse line by patient. Patient states she was told by [facility] that they do not have the insulin needed to fill prescription.

## 2021-05-19 NOTE — Telephone Encounter (Signed)
Call placed back to pt. [facility] states that [facility] at this time. [facility] Pt will notify us is unable to get filled.  Judeth Cornfield, RN

## 2021-05-20 ENCOUNTER — Other Ambulatory Visit: Payer: Self-pay | Admitting: *Deleted

## 2021-05-20 ENCOUNTER — Ambulatory Visit: Payer: Managed Care, Other (non HMO) | Admitting: *Deleted

## 2021-05-20 ENCOUNTER — Encounter: Payer: Self-pay | Admitting: *Deleted

## 2021-05-20 ENCOUNTER — Ambulatory Visit: Payer: Managed Care, Other (non HMO)

## 2021-05-20 ENCOUNTER — Ambulatory Visit: Payer: Managed Care, Other (non HMO) | Attending: Obstetrics

## 2021-05-20 ENCOUNTER — Other Ambulatory Visit: Payer: Self-pay

## 2021-05-20 VITALS — BP 118/66 | HR 95

## 2021-05-20 DIAGNOSIS — O34219 Maternal care for unspecified type scar from previous cesarean delivery: Secondary | ICD-10-CM | POA: Diagnosis not present

## 2021-05-20 DIAGNOSIS — O99213 Obesity complicating pregnancy, third trimester: Secondary | ICD-10-CM

## 2021-05-20 DIAGNOSIS — O3430 Maternal care for cervical incompetence, unspecified trimester: Secondary | ICD-10-CM

## 2021-05-20 DIAGNOSIS — Z3A30 30 weeks gestation of pregnancy: Secondary | ICD-10-CM

## 2021-05-20 DIAGNOSIS — Z6832 Body mass index (BMI) 32.0-32.9, adult: Secondary | ICD-10-CM

## 2021-05-20 DIAGNOSIS — O24113 Pre-existing diabetes mellitus, type 2, in pregnancy, third trimester: Secondary | ICD-10-CM | POA: Diagnosis not present

## 2021-05-20 DIAGNOSIS — O3432 Maternal care for cervical incompetence, second trimester: Secondary | ICD-10-CM | POA: Insufficient documentation

## 2021-05-20 DIAGNOSIS — O3433 Maternal care for cervical incompetence, third trimester: Secondary | ICD-10-CM | POA: Diagnosis not present

## 2021-05-20 DIAGNOSIS — E119 Type 2 diabetes mellitus without complications: Secondary | ICD-10-CM

## 2021-05-20 DIAGNOSIS — E669 Obesity, unspecified: Secondary | ICD-10-CM

## 2021-05-24 ENCOUNTER — Telehealth: Payer: Self-pay | Admitting: *Deleted

## 2021-05-24 NOTE — Telephone Encounter (Signed)
Pt left VM message stating that she is calling about getting FMLA paperwork. I returned pt's call and answered her questions regarding the process for completion of FMLA papers. Pt stated she will need intermittent FMLA in order to attend weekly visits for the remainder of her pregnancy as well as for 6 wks post partum. Pt was advised to attach a note with this information when she brings the papers to the office. She voiced understanding.

## 2021-05-26 ENCOUNTER — Encounter: Payer: Self-pay | Admitting: General Practice

## 2021-05-27 ENCOUNTER — Encounter: Payer: Self-pay | Admitting: Obstetrics and Gynecology

## 2021-05-27 ENCOUNTER — Ambulatory Visit (INDEPENDENT_AMBULATORY_CARE_PROVIDER_SITE_OTHER): Payer: Managed Care, Other (non HMO) | Admitting: Obstetrics and Gynecology

## 2021-05-27 ENCOUNTER — Other Ambulatory Visit: Payer: Self-pay

## 2021-05-27 VITALS — BP 124/72 | HR 98 | Wt 218.0 lb

## 2021-05-27 DIAGNOSIS — O09299 Supervision of pregnancy with other poor reproductive or obstetric history, unspecified trimester: Secondary | ICD-10-CM

## 2021-05-27 DIAGNOSIS — O24319 Unspecified pre-existing diabetes mellitus in pregnancy, unspecified trimester: Secondary | ICD-10-CM

## 2021-05-27 DIAGNOSIS — Z98891 History of uterine scar from previous surgery: Secondary | ICD-10-CM

## 2021-05-27 DIAGNOSIS — O0990 Supervision of high risk pregnancy, unspecified, unspecified trimester: Secondary | ICD-10-CM

## 2021-05-27 NOTE — Progress Notes (Signed)
   PRENATAL VISIT NOTE  Subjective:  Toni Morgan is a 35 y.o. G4P1021 at [redacted]w[redacted]d being seen today for ongoing prenatal care.  She is currently monitored for the following issues for this high-risk pregnancy and has Supervision of high risk pregnancy, antepartum; Obesity (BMI 30.0-34.9); Pre-existing diabetes mellitus during pregnancy, antepartum; History of cesarean delivery; Short cervical length during pregnancy, second trimester; and History of cervical incompetence in pregnancy, currently pregnant on their problem list.  Patient reports  cramping .  Contractions: Irritability. Vag. Bleeding: None.  Movement: Present. Denies leaking of fluid.   The following portions of the patient's history were reviewed and updated as appropriate: allergies, current medications, past family history, past medical history, past social history, past surgical history and problem list.   Objective:   Vitals:   05/27/21 0840  BP: 124/72  Pulse: 98  Weight: 218 lb (98.9 kg)    Fetal Status: Fetal Heart Rate (bpm): 154 Fundal Height: 32 cm Movement: Present     General:  Alert, oriented and cooperative. Patient is in no acute distress.  Skin: Skin is warm and dry. No rash noted.   Cardiovascular: Normal heart rate noted  Respiratory: Normal respiratory effort, no problems with respiration noted  Abdomen: Soft, gravid, appropriate for gestational age.  Pain/Pressure: Present     Pelvic: Cervical exam deferred        Extremities: Normal range of motion.  Edema: Trace  Mental Status: Normal mood and affect. Normal behavior. Normal judgment and thought content.   Assessment and Plan:  Pregnancy: G4P1021 at [redacted]w[redacted]d 1. History of cervical incompetence in pregnancy, currently pregnant - Discussed removal at about 36 weeks - Reviewed s/sx of PTL and when to go to the hospital  2. History of cesarean delivery - Desires TOLAC - consent form signed  3. Supervision of high risk pregnancy, antepartum - Routine  pregnancy up to date  4. Pre-existing diabetes mellitus during pregnancy, antepartum - Continue serial growth scans - last scan was 9/16 and wnl - Start BPP/NST next week - Reviewed log on her phone. Discussed current regimen - 12/10 - will increase to 12/14 of NPH.  - US FETAL BPP W/NONSTRESS; Future  Term labor symptoms and general obstetric precautions including but not limited to vaginal bleeding, contractions, leaking of fluid and fetal movement were reviewed in detail with the patient. Please refer to After Visit Summary for other counseling recommendations.   Return in about 2 weeks (around 06/10/2021) for OB VISIT, MD only, Needs BPP/NST weekly.  Future Appointments  Date Time Provider Department Center  06/01/2021  3:15 PM [facility] NST [facility]  06/09/2021  9:15 AM Constant, Peggy, MD [facility]  06/09/2021 10:15 AM [facility] NST [facility]  06/17/2021  8:45 AM [facility] NURSE [facility]  06/17/2021  9:00 AM [facility] US1 [facility]    Milas Hock, MD

## 2021-06-01 ENCOUNTER — Other Ambulatory Visit: Payer: Self-pay

## 2021-06-01 ENCOUNTER — Ambulatory Visit (INDEPENDENT_AMBULATORY_CARE_PROVIDER_SITE_OTHER): Payer: Managed Care, Other (non HMO)

## 2021-06-01 ENCOUNTER — Ambulatory Visit: Payer: Managed Care, Other (non HMO) | Admitting: *Deleted

## 2021-06-01 DIAGNOSIS — Z3A32 32 weeks gestation of pregnancy: Secondary | ICD-10-CM | POA: Diagnosis not present

## 2021-06-01 DIAGNOSIS — O24319 Unspecified pre-existing diabetes mellitus in pregnancy, unspecified trimester: Secondary | ICD-10-CM

## 2021-06-02 ENCOUNTER — Other Ambulatory Visit: Payer: Managed Care, Other (non HMO)

## 2021-06-09 ENCOUNTER — Inpatient Hospital Stay (HOSPITAL_COMMUNITY): Payer: Managed Care, Other (non HMO) | Admitting: Anesthesiology

## 2021-06-09 ENCOUNTER — Encounter (HOSPITAL_COMMUNITY)
Admission: AD | Disposition: A | Discharge status: Home / Self Care | Payer: Self-pay | Source: Home / Self Care | Attending: Obstetrics and Gynecology

## 2021-06-09 ENCOUNTER — Encounter (HOSPITAL_COMMUNITY): Payer: Self-pay | Admitting: Family Medicine

## 2021-06-09 ENCOUNTER — Ambulatory Visit (INDEPENDENT_AMBULATORY_CARE_PROVIDER_SITE_OTHER): Payer: Managed Care, Other (non HMO) | Admitting: Obstetrics and Gynecology

## 2021-06-09 ENCOUNTER — Ambulatory Visit: Payer: Managed Care, Other (non HMO) | Admitting: *Deleted

## 2021-06-09 ENCOUNTER — Other Ambulatory Visit: Payer: Self-pay

## 2021-06-09 ENCOUNTER — Encounter: Payer: Managed Care, Other (non HMO) | Admitting: Obstetrics and Gynecology

## 2021-06-09 ENCOUNTER — Ambulatory Visit (INDEPENDENT_AMBULATORY_CARE_PROVIDER_SITE_OTHER): Payer: Managed Care, Other (non HMO)

## 2021-06-09 ENCOUNTER — Encounter: Payer: Self-pay | Admitting: Obstetrics and Gynecology

## 2021-06-09 ENCOUNTER — Inpatient Hospital Stay (HOSPITAL_COMMUNITY)
Admission: AD | Admit: 2021-06-09 | Discharge: 2021-06-15 | DRG: 796 | Disposition: A | Discharge status: Home / Self Care | Payer: Managed Care, Other (non HMO) | Attending: Obstetrics and Gynecology | Admitting: Obstetrics and Gynecology

## 2021-06-09 VITALS — BP 119/75 | HR 106 | Wt 219.7 lb

## 2021-06-09 DIAGNOSIS — O24319 Unspecified pre-existing diabetes mellitus in pregnancy, unspecified trimester: Secondary | ICD-10-CM | POA: Diagnosis not present

## 2021-06-09 DIAGNOSIS — O99214 Obesity complicating childbirth: Secondary | ICD-10-CM | POA: Diagnosis present

## 2021-06-09 DIAGNOSIS — O42913 Preterm premature rupture of membranes, unspecified as to length of time between rupture and onset of labor, third trimester: Principal | ICD-10-CM | POA: Diagnosis present

## 2021-06-09 DIAGNOSIS — Z302 Encounter for sterilization: Secondary | ICD-10-CM | POA: Diagnosis not present

## 2021-06-09 DIAGNOSIS — O26872 Cervical shortening, second trimester: Secondary | ICD-10-CM

## 2021-06-09 DIAGNOSIS — O3433 Maternal care for cervical incompetence, third trimester: Secondary | ICD-10-CM | POA: Diagnosis present

## 2021-06-09 DIAGNOSIS — O4693 Antepartum hemorrhage, unspecified, third trimester: Secondary | ICD-10-CM | POA: Diagnosis present

## 2021-06-09 DIAGNOSIS — Z7984 Long term (current) use of oral hypoglycemic drugs: Secondary | ICD-10-CM

## 2021-06-09 DIAGNOSIS — O2412 Pre-existing diabetes mellitus, type 2, in childbirth: Secondary | ICD-10-CM | POA: Diagnosis present

## 2021-06-09 DIAGNOSIS — Z794 Long term (current) use of insulin: Secondary | ICD-10-CM

## 2021-06-09 DIAGNOSIS — E669 Obesity, unspecified: Secondary | ICD-10-CM | POA: Diagnosis present

## 2021-06-09 DIAGNOSIS — O134 Gestational [pregnancy-induced] hypertension without significant proteinuria, complicating childbirth: Secondary | ICD-10-CM | POA: Diagnosis not present

## 2021-06-09 DIAGNOSIS — O0990 Supervision of high risk pregnancy, unspecified, unspecified trimester: Secondary | ICD-10-CM

## 2021-06-09 DIAGNOSIS — E119 Type 2 diabetes mellitus without complications: Secondary | ICD-10-CM | POA: Diagnosis present

## 2021-06-09 DIAGNOSIS — O34219 Maternal care for unspecified type scar from previous cesarean delivery: Secondary | ICD-10-CM | POA: Diagnosis present

## 2021-06-09 DIAGNOSIS — Z20822 Contact with and (suspected) exposure to covid-19: Secondary | ICD-10-CM | POA: Diagnosis present

## 2021-06-09 DIAGNOSIS — O26873 Cervical shortening, third trimester: Secondary | ICD-10-CM | POA: Diagnosis present

## 2021-06-09 DIAGNOSIS — O09299 Supervision of pregnancy with other poor reproductive or obstetric history, unspecified trimester: Secondary | ICD-10-CM

## 2021-06-09 DIAGNOSIS — Z98891 History of uterine scar from previous surgery: Secondary | ICD-10-CM

## 2021-06-09 DIAGNOSIS — Z3A33 33 weeks gestation of pregnancy: Secondary | ICD-10-CM | POA: Diagnosis not present

## 2021-06-09 DIAGNOSIS — O34211 Maternal care for low transverse scar from previous cesarean delivery: Secondary | ICD-10-CM | POA: Diagnosis not present

## 2021-06-09 DIAGNOSIS — O24424 Gestational diabetes mellitus in childbirth, insulin controlled: Secondary | ICD-10-CM | POA: Diagnosis not present

## 2021-06-09 HISTORY — PX: CERVICAL CERCLAGE: SHX1329

## 2021-06-09 LAB — URINALYSIS, ROUTINE W REFLEX MICROSCOPIC
Bilirubin Urine: NEGATIVE
Glucose, UA: NEGATIVE mg/dL
Ketones, ur: NEGATIVE mg/dL
Nitrite: NEGATIVE
Protein, ur: 100 mg/dL — AB
RBC / HPF: >50 RBC/hpf — ABNORMAL HIGH (ref 0–5)
Specific Gravity, Urine: 1.023 (ref 1.005–1.030)
WBC, UA: >50 WBC/hpf — ABNORMAL HIGH (ref 0–5)
pH: 6 (ref 5.0–8.0)

## 2021-06-09 LAB — CBC
HCT: 32.7 % — ABNORMAL LOW (ref 36.0–46.0)
Hemoglobin: 10.4 g/dL — ABNORMAL LOW (ref 12.0–15.0)
MCH: 25.4 pg — ABNORMAL LOW (ref 26.0–34.0)
MCHC: 31.8 g/dL (ref 30.0–36.0)
MCV: 80 fL (ref 80.0–100.0)
Platelets: 285 10*3/uL (ref 150–400)
RBC: 4.09 MIL/uL (ref 3.87–5.11)
RDW: 13.1 % (ref 11.5–15.5)
WBC: 10.6 10*3/uL — ABNORMAL HIGH (ref 4.0–10.5)
nRBC: 0 % (ref 0.0–0.2)

## 2021-06-09 LAB — RESP PANEL BY RT-PCR (FLU A&B, COVID) ARPGX2
Influenza A by PCR: NEGATIVE
Influenza B by PCR: NEGATIVE
SARS Coronavirus 2 by RT PCR: NEGATIVE

## 2021-06-09 LAB — GLUCOSE, CAPILLARY
Glucose-Capillary: 159 mg/dL — ABNORMAL HIGH (ref 70–99)
Glucose-Capillary: 164 mg/dL — ABNORMAL HIGH (ref 70–99)
Glucose-Capillary: 165 mg/dL — ABNORMAL HIGH (ref 70–99)
Glucose-Capillary: 94 mg/dL (ref 70–99)

## 2021-06-09 LAB — TYPE AND SCREEN
ABO/RH(D): B POS
Antibody Screen: NEGATIVE

## 2021-06-09 SURGERY — CERCLAGE, CERVIX, VAGINAL APPROACH
Anesthesia: Spinal | Laterality: Bilateral | Wound class: Clean Contaminated

## 2021-06-09 MED ORDER — MAGNESIUM SULFATE BOLUS VIA INFUSION
4.0000 g | Freq: Once | INTRAVENOUS | Status: AC
  Administered 2021-06-09: 4 g via INTRAVENOUS
  Filled 2021-06-09: qty 1000

## 2021-06-09 MED ORDER — FENTANYL CITRATE (PF) 100 MCG/2ML IJ SOLN
INTRAMUSCULAR | Status: AC
  Filled 2021-06-09: qty 2

## 2021-06-09 MED ORDER — LIDOCAINE HCL (PF) 1 % IJ SOLN
30.0000 mL | INTRAMUSCULAR | Status: DC | PRN
Start: 2021-06-09 — End: 2021-06-13

## 2021-06-09 MED ORDER — PHENYLEPHRINE HCL-NACL 20-0.9 MG/250ML-% IV SOLN
INTRAVENOUS | Status: AC
  Filled 2021-06-09: qty 250

## 2021-06-09 MED ORDER — PENICILLIN G POT IN DEXTROSE 60000 UNIT/ML IV SOLN
3.0000 10*6.[IU] | INTRAVENOUS | Status: DC
  Administered 2021-06-09 – 2021-06-12 (×15): 3 10*6.[IU] via INTRAVENOUS
  Filled 2021-06-09 (×15): qty 50

## 2021-06-09 MED ORDER — ONDANSETRON HCL 4 MG/2ML IJ SOLN
INTRAMUSCULAR | Status: DC | PRN
  Administered 2021-06-09: 4 mg via INTRAVENOUS

## 2021-06-09 MED ORDER — FENTANYL CITRATE (PF) 100 MCG/2ML IJ SOLN
INTRAMUSCULAR | Status: DC | PRN
  Administered 2021-06-09: 25 ug via INTRATHECAL

## 2021-06-09 MED ORDER — FENTANYL-BUPIVACAINE-NACL 0.5-0.125-0.9 MG/250ML-% EP SOLN
12.0000 mL/h | EPIDURAL | Status: DC | PRN
  Filled 2021-06-09 (×2): qty 250

## 2021-06-09 MED ORDER — PHENYLEPHRINE HCL-NACL 20-0.9 MG/250ML-% IV SOLN
INTRAVENOUS | Status: DC | PRN
  Administered 2021-06-09: 60 ug/min via INTRAVENOUS

## 2021-06-09 MED ORDER — PHENYLEPHRINE 40 MCG/ML (10ML) SYRINGE FOR IV PUSH (FOR BLOOD PRESSURE SUPPORT): 80.0000 ug | PREFILLED_SYRINGE | INTRAVENOUS | Status: DC | PRN

## 2021-06-09 MED ORDER — MAGNESIUM SULFATE 40 GM/1000ML IV SOLN
2.0000 g/h | INTRAVENOUS | Status: DC
  Administered 2021-06-09 – 2021-06-10 (×3): 2 g/h via INTRAVENOUS
  Filled 2021-06-09 (×2): qty 1000

## 2021-06-09 MED ORDER — LACTATED RINGERS IV SOLN
500.0000 mL | Freq: Once | INTRAVENOUS | Status: AC
  Administered 2021-06-09: 500 mL via INTRAVENOUS

## 2021-06-09 MED ORDER — CHLOROPROCAINE HCL 50 MG/5ML IT SOLN
INTRATHECAL | Status: AC
  Filled 2021-06-09: qty 5

## 2021-06-09 MED ORDER — STERILE WATER FOR IRRIGATION IR SOLN
Status: DC | PRN
  Administered 2021-06-09: 1000 mL

## 2021-06-09 MED ORDER — INSULIN ASPART 100 UNIT/ML IJ SOLN
0.0000 [IU] | INTRAMUSCULAR | Status: DC
Start: 2021-06-09 — End: 2021-06-11
  Administered 2021-06-09: 3 [IU] via SUBCUTANEOUS
  Administered 2021-06-09 – 2021-06-10 (×3): 2 [IU] via SUBCUTANEOUS
  Administered 2021-06-10: 1 [IU] via SUBCUTANEOUS
  Administered 2021-06-10: 4 [IU] via SUBCUTANEOUS
  Administered 2021-06-10 – 2021-06-11 (×4): 2 [IU] via SUBCUTANEOUS
  Administered 2021-06-11: 3 [IU] via SUBCUTANEOUS
  Administered 2021-06-11: 2 [IU] via SUBCUTANEOUS
  Administered 2021-06-11: 3 [IU] via SUBCUTANEOUS

## 2021-06-09 MED ORDER — PHENYLEPHRINE 40 MCG/ML (10ML) SYRINGE FOR IV PUSH (FOR BLOOD PRESSURE SUPPORT)
PREFILLED_SYRINGE | INTRAVENOUS | Status: AC
  Filled 2021-06-09: qty 10

## 2021-06-09 MED ORDER — EPHEDRINE 5 MG/ML INJ: 10.0000 mg | INTRAVENOUS | Status: DC | PRN

## 2021-06-09 MED ORDER — OXYTOCIN BOLUS FROM INFUSION: 333.0000 mL | Freq: Once | INTRAVENOUS | Status: DC

## 2021-06-09 MED ORDER — PHENYLEPHRINE 40 MCG/ML (10ML) SYRINGE FOR IV PUSH (FOR BLOOD PRESSURE SUPPORT)
PREFILLED_SYRINGE | INTRAVENOUS | Status: DC | PRN
  Administered 2021-06-09 (×3): 80 ug via INTRAVENOUS

## 2021-06-09 MED ORDER — SOD CITRATE-CITRIC ACID 500-334 MG/5ML PO SOLN
30.0000 mL | Freq: Once | ORAL | Status: AC
  Administered 2021-06-09: 30 mL via ORAL
  Filled 2021-06-09: qty 30

## 2021-06-09 MED ORDER — BUPIVACAINE HCL (PF) 0.5 % IJ SOLN
INTRAMUSCULAR | Status: AC
  Filled 2021-06-09: qty 30

## 2021-06-09 MED ORDER — LACTATED RINGERS IV SOLN: 500.0000 mL | INTRAVENOUS | Status: DC | PRN

## 2021-06-09 MED ORDER — SODIUM CHLORIDE 0.9 % IV SOLN
5.0000 10*6.[IU] | Freq: Once | INTRAVENOUS | Status: AC
  Administered 2021-06-09: 5 10*6.[IU] via INTRAVENOUS
  Filled 2021-06-09: qty 5

## 2021-06-09 MED ORDER — SODIUM CHLORIDE 0.9 % IV SOLN
2.0000 g | Freq: Once | INTRAVENOUS | Status: AC
  Administered 2021-06-09: 2 g via INTRAVENOUS
  Filled 2021-06-09: qty 2000

## 2021-06-09 MED ORDER — FENTANYL CITRATE (PF) 100 MCG/2ML IJ SOLN
50.0000 ug | INTRAMUSCULAR | Status: DC | PRN
  Administered 2021-06-09: 100 ug via INTRAVENOUS
  Administered 2021-06-09: 50 ug via INTRAVENOUS
  Administered 2021-06-09 – 2021-06-13 (×8): 100 ug via INTRAVENOUS
  Filled 2021-06-09 (×10): qty 2

## 2021-06-09 MED ORDER — DIPHENHYDRAMINE HCL 50 MG/ML IJ SOLN
12.5000 mg | INTRAMUSCULAR | Status: DC | PRN
Start: 2021-06-09 — End: 2021-06-12

## 2021-06-09 MED ORDER — COMFORT FIT MATERNITY SUPP LG MISC: 0 refills | Status: DC

## 2021-06-09 MED ORDER — OXYTOCIN-SODIUM CHLORIDE 30-0.9 UT/500ML-% IV SOLN: 2.5000 [IU]/h | INTRAVENOUS | Status: DC

## 2021-06-09 MED ORDER — SOD CITRATE-CITRIC ACID 500-334 MG/5ML PO SOLN
30.0000 mL | ORAL | Status: DC | PRN
  Administered 2021-06-11: 30 mL via ORAL
  Filled 2021-06-09: qty 30

## 2021-06-09 MED ORDER — CHLOROPROCAINE HCL 50 MG/5ML IT SOLN
INTRATHECAL | Status: DC | PRN
  Administered 2021-06-09: 4 mL via INTRATHECAL

## 2021-06-09 MED ORDER — ONDANSETRON HCL 4 MG/2ML IJ SOLN
INTRAMUSCULAR | Status: AC
  Filled 2021-06-09: qty 2

## 2021-06-09 MED ORDER — BETAMETHASONE SOD PHOS & ACET 6 (3-3) MG/ML IJ SUSP
12.0000 mg | INTRAMUSCULAR | Status: AC
Start: 2021-06-09 — End: 2021-06-10
  Administered 2021-06-09 – 2021-06-10 (×2): 12 mg via INTRAMUSCULAR
  Filled 2021-06-09: qty 5

## 2021-06-09 MED ORDER — ONDANSETRON HCL 4 MG/2ML IJ SOLN
4.0000 mg | Freq: Four times a day (QID) | INTRAMUSCULAR | Status: DC | PRN
  Filled 2021-06-09: qty 2

## 2021-06-09 MED ORDER — ONDANSETRON HCL 4 MG/2ML IJ SOLN: 4.0000 mg | Freq: Once | INTRAMUSCULAR | Status: DC | PRN

## 2021-06-09 MED ORDER — LACTATED RINGERS IV SOLN: INTRAVENOUS | Status: DC

## 2021-06-09 MED ORDER — ACETAMINOPHEN 325 MG PO TABS: 650.0000 mg | ORAL_TABLET | ORAL | Status: DC | PRN

## 2021-06-09 MED ORDER — FENTANYL-BUPIVACAINE-NACL 0.5-0.125-0.9 MG/250ML-% EP SOLN
EPIDURAL | Status: DC | PRN
  Administered 2021-06-09: 12 mL/h via EPIDURAL
  Administered 2021-06-10 – 2021-06-13 (×2): 500 ug via EPIDURAL

## 2021-06-09 SURGICAL SUPPLY — 17 items
CANISTER SUCT 3000ML PPV (MISCELLANEOUS) ×2 IMPLANT
DECANTER SPIKE VIAL GLASS SM (MISCELLANEOUS) IMPLANT
GLOVE BIOGEL PI IND STRL 7.0 (GLOVE) ×2 IMPLANT
GLOVE BIOGEL PI INDICATOR 7.0 (GLOVE) ×2
GLOVE SURG SS PI 7.0 STRL IVOR (GLOVE) ×2 IMPLANT
GOWN STRL REUS W/TWL LRG LVL3 (GOWN DISPOSABLE) ×2 IMPLANT
GOWN STRL REUS W/TWL XL LVL3 (GOWN DISPOSABLE) ×2 IMPLANT
NS IRRIG 1000ML POUR BTL (IV SOLUTION) ×2 IMPLANT
PACK VAGINAL MINOR WOMEN LF (CUSTOM PROCEDURE TRAY) ×2 IMPLANT
PAD OB MATERNITY 4.3X12.25 (PERSONAL CARE ITEMS) ×2 IMPLANT
PAD PREP 24X48 CUFFED NSTRL (MISCELLANEOUS) ×2 IMPLANT
SUT MERSILENE 5MM BP 1 12 (SUTURE) ×2 IMPLANT
SUT SILK 2 0 FSL 18 (SUTURE) ×2 IMPLANT
TOWEL OR 17X24 6PK STRL BLUE (TOWEL DISPOSABLE) ×4 IMPLANT
TRAY FOLEY W/BAG SLVR 14FR (SET/KITS/TRAYS/PACK) ×2 IMPLANT
TUBING NON-CON 1/4 X 20 CONN (TUBING) IMPLANT
YANKAUER SUCT BULB TIP NO VENT (SUCTIONS) IMPLANT

## 2021-06-09 NOTE — Anesthesia Procedure Notes (Signed)
Spinal  Patient location during procedure: OR Reason for block: surgical anesthesia Staffing Performed: anesthesiologist  Anesthesiologist: Lucca Greggs E, MD Preanesthetic Checklist Completed: patient identified, IV checked, risks and benefits discussed, surgical consent, monitors and equipment checked, pre-op evaluation and timeout performed Spinal Block Patient position: sitting Prep: DuraPrep Patient monitoring: continuous pulse ox, blood pressure and heart rate Approach: midline Location: L3-4 Injection technique: catheter Needle Needle type: Tuohy and Whitacre  Needle gauge: 25 G Needle length: 12.7 cm Catheter type: closed end flexible Catheter size: 19 g Assessment Events: CSF return Additional Notes The patient was prepped and draped in the usual sterile fashion. A combined spinal-epidural technique was performed with a 9 cm 17g Tuohy needle and loss of resistance technique. After encountering LOR, a 12.7 cm 25g Pencan spinal needle was introduced via the Tuohy and clear CSF was aspirated prior to injection of local anesthetic. The spinal needle was removed and a 19 g flexible epidural catheter placed prior to Tuohy removal. Patient tolerated the procedure well without complications.    

## 2021-06-09 NOTE — Progress Notes (Signed)
   PRENATAL VISIT NOTE  Subjective:  Toni Morgan is a 35 y.o. [phone] at [redacted]w[redacted]d being seen today for ongoing prenatal care.  She is currently monitored for the following issues for this high-risk pregnancy and has Supervision of high risk pregnancy, antepartum; Obesity (BMI 30.0-34.9); Pre-existing diabetes mellitus during pregnancy, antepartum; History of cesarean delivery; Short cervical length during pregnancy, second trimester; and History of cervical incompetence in pregnancy, currently pregnant on their problem list.  Patient reports backache.  Contractions: Irritability. Vag. Bleeding: None.  Movement: Present. Denies leaking of fluid.   The following portions of the patient's history were reviewed and updated as appropriate: allergies, current medications, past family history, past medical history, past social history, past surgical history and problem list.   Objective:   Vitals:   06/09/21 0924  BP: 119/75  Pulse: (!) 106  Weight: 219 lb 11.2 oz (99.7 kg)    Fetal Status: Fetal Heart Rate (bpm): 141 Fundal Height: 33 cm Movement: Present     General:  Alert, oriented and cooperative. Patient is in no acute distress.  Skin: Skin is warm and dry. No rash noted.   Cardiovascular: Normal heart rate noted  Respiratory: Normal respiratory effort, no problems with respiration noted  Abdomen: Soft, gravid, appropriate for gestational age.  Pain/Pressure: Present     Pelvic: Cervical exam deferred        Extremities: Normal range of motion.  Edema: Trace  Mental Status: Normal mood and affect. Normal behavior. Normal judgment and thought content.   Assessment and Plan:  Pregnancy: G4P1021 at [redacted]w[redacted]d 1. Supervision of high risk pregnancy, antepartum Patient is doing well Rx maternity support belt provided  2. History of cesarean delivery Desires TOLAC  3. Pre-existing diabetes mellitus during pregnancy, antepartum CBGs reviewed and great majority within range Some elevated  fasting values as high as 103 Discussed consuming a protein rich snack at bedtime Continue current NPH dosing 12/14 Continue weekly antenatal testing and growth ultrasounds with MFM  4. Short cervical length during pregnancy, second trimester Cerclage in situ Plan for removal at 36-37 weeks  Preterm labor symptoms and general obstetric precautions including but not limited to vaginal bleeding, contractions, leaking of fluid and fetal movement were reviewed in detail with the patient. Please refer to After Visit Summary for other counseling recommendations.   Return in about 2 weeks (around 06/23/2021) for in person, ROB, High risk.  Future Appointments  Date Time Provider Department Center  06/09/2021 10:15 AM [facility] NST [facility]  06/17/2021  8:45 AM [facility] NURSE [facility]  06/17/2021  9:00 AM [facility] US1 [facility]  06/22/2021  2:15 PM [facility] NST [facility]  06/22/2021  3:55 PM Eckstat, Mary Sella, MD [facility]    Catalina Antigua, MD

## 2021-06-09 NOTE — Progress Notes (Signed)
Labor Progress Note Toni Morgan is a 35 y.o. [id number] at [redacted]w[redacted]d who presented for PTL.   S: Doing well. No major concerns at this time.  O:  BP 137/83   Pulse (!) 112   Temp 98.8 F (37.1 C)   Resp 18   LMP 10/20/2020   SpO2 98%   EFM: Baseline 135 bpm, moderate variability, + accels, intermittent variable decels  Toco: Contracting every 2-4 minutes   CVE: Dilation: 4.5 Effacement (%): 50 Presentation: Vertex Exam by:: Dr. Vergie Living   A&P: 35 y.o. [id number] [redacted]w[redacted]d   #PTL: S/p betamethasone and Mg. Will plan to recheck at 9:30-10 PM. If unchanged, will plan to transfer back to antepartum for ongoing monitoring.  #Pain: Combined spinal/epidural in place. Fentanyl PRN ordered for pain management in the interim.  #FWB: Cat 2 due to intermittent variable decels; reassuring variability and accels  #GBS unknown; received Amp x1 and transitioned to PCN  #T2DM: Continue Q4hr CBGs. SSI ordered for now while on L&D. May need to transition to home dosing of NPH if transferred to antepartum.   Worthy Rancher, MD 7:44 PM

## 2021-06-09 NOTE — Progress Notes (Signed)
Pt informed that the ultrasound is considered a limited OB ultrasound and is not intended to be a complete ultrasound exam.  Patient also informed that the ultrasound is not being completed with the intent of assessing for fetal or placental anomalies or any pelvic abnormalities.  Explained that the purpose of today's ultrasound is to assess for presentation, BPP and amniotic fluid volume.  Patient acknowledges the purpose of the exam and the limitations of the study.    Low normal AFI again noted today - stable since last week. Pt denies leakage of fluid from vagina.

## 2021-06-09 NOTE — Brief Op Note (Signed)
06/09/2021  5:01 PM  PATIENT:  Toni Morgan  35 y.o. female  PRE-OPERATIVE DIAGNOSIS:  cerclage removal  POST-OPERATIVE DIAGNOSIS:  unsuccessful cerclage removal  PROCEDURE:  Exam under anesthesia  SURGEON:  Surgeon(s) and Role:    [place] Bing, MD - Primary  ASSISTANTS: none   ANESTHESIA:    spinal (epidural catheter placed)  EBL:  39mL  BLOOD ADMINISTERED:none  DRAINS:  indwelling foley    LOCAL MEDICATIONS USED:  NONE  SPECIMEN:  No Specimen  DISPOSITION OF SPECIMEN:  N/A  COUNTS:  YES  TOURNIQUET:  * No tourniquets in log *  DICTATION: .Note written in EPIC  PLAN OF CARE: Admit to inpatient   PATIENT DISPOSITION:  PACU - hemodynamically stable.   Delay start of Pharmacological VTE agent (>24hrs) due to surgical blood loss or risk of bleeding: not applicable  At the end of the procedure, she was 4-5cm/very effaced and it feels like right at the external os  but in the cervical stroma there felt like a thin band consistent with the prolene cerclage suture. I told the patient that she may be able to deliver vaginally if the cerclage pulls through which may happen because the since it's a monofilament suture and if the knot was cut to where there was only one throw in place then it may unravel with labor. I told her that if she starts to have bleeding like there cervix may be tearing then I would recommend a c-section.   Toni Copa MD Attending Center for [employer] (Faculty Practice) 06/09/2021 Time: [phone]

## 2021-06-09 NOTE — Progress Notes (Signed)
Patient ID: Toni Morgan, female   DOB: December 04, 1985, 35 y.o.   MRN: [id number] Getting more uncomfortable  Vitals:   06/09/21 2200 06/09/21 2205 06/09/21 2210 06/09/21 2215  BP: 125/76 127/73 125/77 122/75  Pulse: 97 97 94 96  Resp:      Temp:      TempSrc:      SpO2: 95% 96% 96% 97%   FHR reactive UCs more regular  Dilation: 5.5 Effacement (%): 80 Station: -2 Presentation: Vertex Exam by:: Dr. Vergie Living  Will get epidural and prepare for delivery

## 2021-06-09 NOTE — Anesthesia Preprocedure Evaluation (Addendum)
Anesthesia Evaluation  Patient identified by MRN, date of birth, ID band Patient awake    Reviewed: Allergy & Precautions, NPO status , Patient's Chart, lab work & pertinent test results  Airway Mallampati: II  TM Distance: >3 FB Neck ROM: Full    Dental no notable dental hx. (+) Teeth Intact, Dental Advisory Given   Pulmonary neg pulmonary ROS,    Pulmonary exam normal breath sounds clear to auscultation       Cardiovascular Exercise Tolerance: Good Normal cardiovascular exam Rhythm:Regular Rate:Normal     Neuro/Psych negative neurological ROS  negative psych ROS   GI/Hepatic Neg liver ROS,   Endo/Other  diabetes  Renal/GU      Musculoskeletal   Abdominal   Peds  Hematology Lab Results      Component                Value               Date                      WBC                      10.6 (H)            06/09/2021                HGB                      10.4 (L)            06/09/2021                HCT                      32.7 (L)            06/09/2021                MCV                      80.0                06/09/2021                PLT                      285                 06/09/2021              Anesthesia Other Findings   Reproductive/Obstetrics (+) Pregnancy                                                             Anesthesia Evaluation  Patient identified by MRN, date of birth, ID band Patient awake    Reviewed: Allergy & Precautions, H&P , NPO status , Patient's Chart, lab work & pertinent test results  History of Anesthesia Complications Negative for: history of anesthetic complications  Airway Mallampati: II  TM Distance: >3 FB     Dental   Pulmonary neg pulmonary ROS,    Pulmonary exam normal        Cardiovascular negative cardio ROS  Rhythm:regular Rate:Normal     Neuro/Psych negative neurological ROS  negative psych ROS    GI/Hepatic negative GI ROS, Neg liver ROS,   Endo/Other  diabetes, Gestational, Insulin Dependent  Renal/GU negative Renal ROS  negative genitourinary   Musculoskeletal   Abdominal   Peds  Hematology negative hematology ROS (+)        Component                Value               Date/Time                 HGB                      10.4 (L)            06/09/2021 1518           HGB                      10.5 (L)            05/06/2021 0907           HCT                      32.7 (L)            06/09/2021 1518           HCT                      31.9 (L)            05/06/2021 0907           PLT                      285                 06/09/2021 1518           PLT                      251                 05/06/2021 0907        Anesthesia Other Findings  Pt presents with vaginal bleeding and cerclage in place. To OR for urgent cerclage removal.  Reproductive/Obstetrics (+) Pregnancy G4P1021 at [redacted]w[redacted]d                            Anesthesia Physical Anesthesia Plan  ASA: 2  Anesthesia Plan: Combined Spinal and Epidural   Post-op Pain Management:    Induction:   PONV Risk Score and Plan: Ondansetron and Treatment may vary due to age or medical condition  Airway Management Planned:   Additional Equipment:   Intra-op Plan:   Post-operative Plan:   Informed Consent: I have reviewed the patients History and Physical, chart, labs and discussed the procedure including the risks, benefits and alternatives for the proposed anesthesia with the patient or authorized representative who has indicated his/her understanding and acceptance.       Plan Discussed with: Anesthesiologist  Anesthesia Plan Comments: (Pt having contractions and abdominal pain and plans to have labor epidural. Will plan for CSE with intrathecal chloroprocaine, and will initiate labor epidural postop. )       Anesthesia Quick Evaluation  Anesthesia  Evaluation  Patient identified by MRN, date of birth, ID band Patient awake    Reviewed: Allergy & Precautions, H&P , NPO status , Patient's Chart, lab work & pertinent test results  History of Anesthesia Complications Negative for: history of anesthetic complications  Airway Mallampati: II  TM Distance: >3 FB     Dental   Pulmonary neg pulmonary ROS,    Pulmonary exam normal        Cardiovascular negative cardio ROS   Rhythm:regular Rate:Normal     Neuro/Psych negative neurological ROS  negative psych ROS   GI/Hepatic negative GI ROS, Neg liver ROS,   Endo/Other  diabetes, Gestational, Insulin Dependent  Renal/GU negative Renal ROS  negative genitourinary   Musculoskeletal   Abdominal   Peds  Hematology negative hematology ROS (+)        Component                Value               Date/Time                 HGB                      10.4 (L)            06/09/2021 1518           HGB                      10.5 (L)            05/06/2021 0907           HCT                      32.7 (L)            06/09/2021 1518           HCT                      31.9 (L)            05/06/2021 0907           PLT                      285                 06/09/2021 1518           PLT                      251                 05/06/2021 0907        Anesthesia Other Findings  Pt presents with vaginal bleeding and cerclage in place. To OR for urgent cerclage removal.  Reproductive/Obstetrics (+) Pregnancy G4P1021 at [redacted]w[redacted]d                            Anesthesia Physical Anesthesia Plan  ASA: 2  Anesthesia Plan: Combined Spinal and Epidural   Post-op Pain Management:    Induction:   PONV Risk Score and Plan: Ondansetron and Treatment may vary due to age or medical condition  Airway Management Planned:   Additional Equipment:   Intra-op Plan:   Post-operative Plan:   Informed Consent: I have reviewed the patients  History and  Physical, chart, labs and discussed the procedure including the risks, benefits and alternatives for the proposed anesthesia with the patient or authorized representative who has indicated his/her understanding and acceptance.       Plan Discussed with: Anesthesiologist  Anesthesia Plan Comments: (Pt having contractions and abdominal pain and plans to have labor epidural. Will plan for CSE with intrathecal chloroprocaine, and will initiate labor epidural postop. )       Anesthesia Quick Evaluation  Anesthesia Physical Anesthesia Plan  ASA: 3  Anesthesia Plan: Epidural   Post-op Pain Management:    Induction:   PONV Risk Score and Plan:   Airway Management Planned:   Additional Equipment:   Intra-op Plan:   Post-operative Plan:   Informed Consent: I have reviewed the patients History and Physical, chart, labs and discussed the procedure including the risks, benefits and alternatives for the proposed anesthesia with the patient or authorized representative who has indicated his/her understanding and acceptance.       Plan Discussed with: CRNA  Anesthesia Plan Comments:         Anesthesia Quick Evaluation

## 2021-06-09 NOTE — H&P (Signed)
History     CSN: [id number]  Arrival date and time: 06/09/21 1336   Event Date/Time   First Provider Initiated Contact with Patient 06/09/21 1402      Chief Complaint  Patient presents with   Vaginal Bleeding   HPI Toni Morgan is a 35 y.o. [id number] at [redacted]w[redacted]d who presents with vaginal bleeding. She reports since 1400 she has been feeling regular contractions every 10 minutes. She rates the contractions a 6/10 and has not taken anything for the pain. She reports she also has been having bright red bleeding since the contractions started. She denies any leaking. Reports normal fetal movement.   She had a cerclage placed on 7/2 due to a profoundly short cervix. She has Type 2 DM on insulin and metformin. She has had a previous c/s but planning TOLAC this pregnancy  OB History     Gravida  4   Para  1   Term  1   Preterm      AB  2   Living  1      SAB  1   IAB  0   Ectopic      Multiple      Live Births  1           Past Medical History:  Diagnosis Date   Abnormal Pap smear of cervix    H/O colposcopy with cervical biopsy    pt reports she needs LEEP but it is on hold due to pregnancy   Type 2 diabetes mellitus (HCC) 02/14/2021    Past Surgical History:  Procedure Laterality Date   CERVICAL CERCLAGE N/A 03/05/2021   Procedure: CERCLAGE CERVICAL;  Surgeon: Tereso Newcomer, MD;  Location: [facility];  Service: Gynecology;  Laterality: N/A;   CESAREAN SECTION N/A 07/02/2013   Procedure: CESAREAN SECTION;  Surgeon: Lavina Hamman, MD;  Location: [facility];  Service: Obstetrics;  Laterality: N/A;   COLPOSCOPY     HERNIA REPAIR  2002   WISDOM TOOTH EXTRACTION      Family History  Problem Relation Age of Onset   Hyperlipidemia Mother    Hypertension Mother    Ovarian cysts Sister    Diabetes Maternal Uncle    Cancer Maternal Grandmother     Social History   Tobacco Use   Smoking status: Never   Smokeless tobacco: Never  Vaping Use   Vaping Use:  Never used  Substance Use Topics   Alcohol use: Not Currently    Comment: occ before pos upt but none now   Drug use: No    Allergies: No Known Allergies  Medications Prior to Admission  Medication Sig Dispense Refill Last Dose   insulin NPH Human (HUMULIN N) 100 UNIT/ML injection 10 units [place] ac breakfast and 6 units HS (Patient taking differently: 12 units [place] ac breakfast and 14 units HS) 10 mL 3 06/08/2021   ACCU-CHEK GUIDE test strip 1 each by Other route 4 (four) times daily. As directed      Accu-Chek Softclix Lancets lancets 4 (four) times daily.      Continuous Blood Gluc Transmit (DEXCOM G6 TRANSMITTER) MISC 1 Device by Does not apply route every 3 (three) months. (Patient not taking: No sig reported) 3 each 1    Elastic Bandages & Supports (COMFORT FIT MATERNITY SUPP LG) MISC Wear daily when ambulating 1 each 0    Insulin Syringe-Needle U-100 30G X 15/64" 0.5 ML MISC 1 Device by Does not apply route  in the morning, at noon, in the evening, and at bedtime. 100 each 6    Prenatal Vit-Fe Fumarate-FA (PRENATAL VITAMINS PO) Take 1 tablet by mouth daily.      progesterone (PROMETRIUM) 200 MG capsule Place 1 capsule (200 mg total) vaginally daily. 30 capsule 2     Review of Systems  Constitutional: Negative.  Negative for fatigue and fever.  HENT: Negative.    Respiratory: Negative.  Negative for shortness of breath.   Cardiovascular: Negative.  Negative for chest pain.  Gastrointestinal:  Positive for abdominal pain. Negative for constipation, diarrhea, nausea and vomiting.  Genitourinary:  Positive for vaginal bleeding. Negative for dysuria and vaginal discharge.  Neurological: Negative.  Negative for dizziness and headaches.  Physical Exam   Blood pressure 131/79, pulse (!) 119, temperature 98.4 F (36.9 C), resp. rate 18, last menstrual period 10/20/2020, unknown if currently breastfeeding.  Physical Exam Vitals and nursing note reviewed.  Constitutional:      General: She  is not in acute distress.    Appearance: She is well-developed.  HENT:     Head: Normocephalic.  Eyes:     Pupils: Pupils are equal, round, and reactive to light.  Cardiovascular:     Rate and Rhythm: Normal rate and regular rhythm.     Heart sounds: Normal heart sounds.  Pulmonary:     Effort: Pulmonary effort is normal. No respiratory distress.     Breath sounds: Normal breath sounds.  Abdominal:     General: Bowel sounds are normal. There is no distension.     Palpations: Abdomen is soft.     Tenderness: There is no abdominal tenderness.  Genitourinary:    Comments: SSE: moderate amount of bright red bleeding in vault, cervix visually open, cerclage knot visualized. Skin:    General: Skin is warm and dry.  Neurological:     Mental Status: She is alert and oriented to person, place, and time.  Psychiatric:        Mood and Affect: Mood normal.        Behavior: Behavior normal.        Thought Content: Thought content normal.        Judgment: Judgment normal.   Fetal Tracing:  Baseline: 130 Variability: moderate Accels: 15x15 Decels: none  Toco: 2-10  Dilation: 3 Effacement (%): 90 Exam by:: Dr. Crissie Reese   MAU Course  Procedures Results for orders placed or performed during the hospital encounter of 06/09/21 (from the past 24 hour(s))  Urinalysis, Routine w reflex microscopic Urine, Clean Catch     Status: Abnormal   Collection Time: 06/09/21  2:20 PM  Result Value Ref Range   Color, Urine YELLOW YELLOW   APPearance CLOUDY (A) CLEAR   Specific Gravity, Urine 1.023 1.005 - 1.030   pH 6.0 5.0 - 8.0   Glucose, UA NEGATIVE NEGATIVE mg/dL   Hgb urine dipstick LARGE (A) NEGATIVE   Bilirubin Urine NEGATIVE NEGATIVE   Ketones, ur NEGATIVE NEGATIVE mg/dL   Protein, ur 413 (A) NEGATIVE mg/dL   Nitrite NEGATIVE NEGATIVE   Leukocytes,Ua MODERATE (A) NEGATIVE   RBC / HPF >50 (H) 0 - 5 RBC/hpf   WBC, UA >50 (H) 0 - 5 WBC/hpf   Bacteria, UA MANY (A) NONE SEEN   Squamous  Epithelial / LPF 11-20 0 - 5   Mucus PRESENT     MDM UA COVID test  Upon exam, cervix found to be 3-4cm with bright red bleeding. Dr. Crissie Reese called to bedside  to confirm exam. Dr. Vergie Living notified of patient arrival in MAU and laboring around cervical cerclage. Will come see patient and remove cerclage  BMZ Magnesium  Ampicillin   Unable to fully remove cerclage at bedside. Will take patient to OR for removal.  Assessment and Plan  -Preterm labor -Cerclage in place -[redacted] weeks gestation  -Patient to OR for cerclage removal. Will go to labor and delivery after removal until she is stable  Rolm Bookbinder CNM 06/09/2021, 2:02 PM

## 2021-06-09 NOTE — MAU Provider Note (Addendum)
OB Note Cerclage removal attempt done. Per op note, done with prolene and knot anteriorly, with no air knot. Patient 3-4cm, BOW felt, cephalic on cervical exam Speculum exam and cervix visually 2-3cm with BOW intact. Whole knot visualized and right suture leg cut but when pulled out this must've been an air knot. Cx exam done and I could feel what feels like the suture at the external os 360 degrees around the cervix but it felt internal. Repeat spec exam done and I could not visualize any suture material. Given this, recommend cerclage removal in the OR.   Fetus cephalic on exam and bedside u/s NICU aware. Continue Mg, BMZ and amp. Fetus category I with accels, toco q3-63m. 1554g, 39% on 9/16  Cornelia Copa MD Attending Center for [employer] (Faculty Practice) 06/09/2021 Time: [phone]

## 2021-06-09 NOTE — Anesthesia Preprocedure Evaluation (Addendum)
Anesthesia Evaluation  Patient identified by MRN, date of birth, ID band Patient awake    Reviewed: Allergy & Precautions, H&P , NPO status , Patient's Chart, lab work & pertinent test results  History of Anesthesia Complications Negative for: history of anesthetic complications  Airway Mallampati: II  TM Distance: >3 FB     Dental   Pulmonary neg pulmonary ROS,    Pulmonary exam normal        Cardiovascular negative cardio ROS   Rhythm:regular Rate:Normal     Neuro/Psych negative neurological ROS  negative psych ROS   GI/Hepatic negative GI ROS, Neg liver ROS,   Endo/Other  diabetes, Gestational, Insulin Dependent  Renal/GU negative Renal ROS  negative genitourinary   Musculoskeletal   Abdominal   Peds  Hematology negative hematology ROS (+)        Component                Value               Date/Time                 HGB                      10.4 (L)            06/09/2021 1518           HGB                      10.5 (L)            05/06/2021 0907           HCT                      32.7 (L)            06/09/2021 1518           HCT                      31.9 (L)            05/06/2021 0907           PLT                      285                 06/09/2021 1518           PLT                      251                 05/06/2021 0907        Anesthesia Other Findings  Pt presents with vaginal bleeding and cerclage in place. To OR for urgent cerclage removal.  Reproductive/Obstetrics (+) Pregnancy [id number] at [redacted]w[redacted]d                            Anesthesia Physical Anesthesia Plan  ASA: 2  Anesthesia Plan: Combined Spinal and Epidural   Post-op Pain Management:    Induction:   PONV Risk Score and Plan: Ondansetron and Treatment may vary due to age or medical condition  Airway Management Planned:   Additional Equipment:   Intra-op Plan:   Post-operative Plan:   Informed Consent: I  have reviewed the patients  History and Physical, chart, labs and discussed the procedure including the risks, benefits and alternatives for the proposed anesthesia with the patient or authorized representative who has indicated his/her understanding and acceptance.       Plan Discussed with: Anesthesiologist  Anesthesia Plan Comments: (Pt having contractions and abdominal pain and plans to have labor epidural. Will plan for CSE with intrathecal chloroprocaine, and will initiate labor epidural postop. )       Anesthesia Quick Evaluation

## 2021-06-09 NOTE — Transfer of Care (Signed)
Immediate Anesthesia Transfer of Care Note  Patient: Toni Morgan  Procedure(s) Performed: CERCLAGE CERVICAL REMOVAL    (Bilateral)  Patient Location: PACU  Anesthesia Type:Spinal  Level of Consciousness: awake  Airway & Oxygen Therapy: Patient Spontanous Breathing  Post-op Assessment: Report given to RN and Post -op Vital signs reviewed and stable  Post vital signs: Reviewed and stable  Last Vitals:  Vitals Value Taken Time  BP 119/76 06/09/21 1716  Temp    Pulse 92 06/09/21 1723  Resp 20 06/09/21 1723  SpO2 98 % 06/09/21 1723  Vitals shown include unvalidated device data.  Last Pain:  Vitals:   06/09/21 1535  TempSrc: Oral  PainSc:          Complications: No notable events documented.

## 2021-06-09 NOTE — Progress Notes (Signed)
NICU charge RN and Neonatologist updated regarding plan of care and pt's dilation at this time.

## 2021-06-09 NOTE — MAU Note (Signed)
Pt reports she has been having ctx since this morning. Had MFM and reg appointment this morning.  Stated they told her she was having ctx 5/hr. Pt stated she is feeling ctx now q 10 min. Had a small amount of vag bleeding at home and now passed a small clot when she went to the BR just now. Good fetal movement felt. Pt has cerclage in place

## 2021-06-10 ENCOUNTER — Encounter: Payer: Self-pay | Admitting: Radiology

## 2021-06-10 ENCOUNTER — Encounter (HOSPITAL_COMMUNITY): Payer: Self-pay | Admitting: Obstetrics and Gynecology

## 2021-06-10 ENCOUNTER — Encounter: Payer: Managed Care, Other (non HMO) | Admitting: Obstetrics and Gynecology

## 2021-06-10 LAB — GLUCOSE, CAPILLARY
Glucose-Capillary: 106 mg/dL — ABNORMAL HIGH (ref 70–99)
Glucose-Capillary: 123 mg/dL — ABNORMAL HIGH (ref 70–99)
Glucose-Capillary: 129 mg/dL — ABNORMAL HIGH (ref 70–99)
Glucose-Capillary: 148 mg/dL — ABNORMAL HIGH (ref 70–99)
Glucose-Capillary: 149 mg/dL — ABNORMAL HIGH (ref 70–99)
Glucose-Capillary: 164 mg/dL — ABNORMAL HIGH (ref 70–99)
Glucose-Capillary: 236 mg/dL — ABNORMAL HIGH (ref 70–99)

## 2021-06-10 LAB — RPR: RPR Ser Ql: NONREACTIVE

## 2021-06-10 MED ORDER — DEXTROSE 5 % IV SOLN
1000.0000 mg | Freq: Once | INTRAVENOUS | Status: DC
Start: 2021-06-10 — End: 2021-06-10

## 2021-06-10 MED ORDER — AZITHROMYCIN 500 MG PO TABS
1000.0000 mg | ORAL_TABLET | Freq: Once | ORAL | Status: AC
  Administered 2021-06-10: 1000 mg via ORAL
  Filled 2021-06-10 (×2): qty 2

## 2021-06-10 NOTE — Progress Notes (Signed)
Labor Progress Note Toni Morgan is a 35 y.o. [phone] at [redacted]w[redacted]d presented for preterm labor  S: feeling well. Has been sleeping off and on. No complaints  O:  BP 110/60   Pulse 93   Temp 97.6 F (36.4 C) (Oral)   Resp 17   LMP 10/20/2020   SpO2 98%  EFM: 120/good variability/+accels/no decels  CVE: Dilation: 6 Effacement (%): 80 Station: -2 Presentation: Vertex Exam by:: Dr. Mathis Fare   A&P: 35 y.o. [id number] [redacted]w[redacted]d in PTL #PTL: Did not recheck cervix. Contractions remain well spaced out ~1-2 every . S/p 2 doses of betamethasone. Consider transfer to antepartum in the morning if no change. #Pain: epidural in place #FWB: cat 1 #ID: GBS unknown. On PCN. S/p azithromycin  T2DM: most recent glucose elevated to 236. Otherwise stable in 100s to 160s. SSI with CBG q 4hrs.  Mabeline Caras, PGY-1, Faculty Service 11:00 PM

## 2021-06-10 NOTE — Progress Notes (Addendum)
ADA Standards of Care 2022 Diabetes in Pregnancy Target Glucose Ranges:  Fasting: 60 - 90 mg/dL Preprandial: 60 - 141 mg/dL 1 hr postprandial: Less than 140mg /dL (from first bite of meal) 2 hr postprandial: Less than 120 mg/dL (from first bit of meal)   History:  Pre-existing Type 2 DM Home meds:  NPH 12 units with breakfast and 14 units q HS Hospital meds:  Novolog 0-14 units q 4 hours   Note patient in active labor with second dose of betamethasone due at 14:30 today.  If appropriate, may consider start of IV insulin/EndoTool with goal of 90-120 mg/dL until delivery.    Thanks,  , RN, BC-ADM Inpatient Diabetes Coordinator Pager [phone]  (8a-5p)

## 2021-06-10 NOTE — Anesthesia Postprocedure Evaluation (Signed)
Anesthesia Post Note  Patient: Toni Morgan  Procedure(s) Performed: CERCLAGE CERVICAL REMOVAL    (Bilateral)     Patient location during evaluation: PACU Anesthesia Type: Spinal Level of consciousness: oriented and awake and alert Pain management: pain level controlled Vital Signs Assessment: post-procedure vital signs reviewed and stable Respiratory status: spontaneous breathing, respiratory function stable and nonlabored ventilation Cardiovascular status: blood pressure returned to baseline and stable Postop Assessment: no headache, no backache, no apparent nausea or vomiting and spinal receding Anesthetic complications: no   No notable events documented.  Last Vitals:  Vitals:   06/10/21 1201 06/10/21 1231  BP: 125/68 112/60  Pulse: 100 95  Resp: 16 16  Temp:    SpO2:      Last Pain:  Vitals:   06/10/21 0930  TempSrc: Oral  PainSc:                  Lucretia Kern

## 2021-06-10 NOTE — Progress Notes (Signed)
Labor Progress Note Toni Morgan is a 35 y.o. [id number] at [redacted]w[redacted]d who presented for PTL.  S: Doing well, no concerns at this time.   O:  BP 132/70   Pulse 95   Temp (!) 97.5 F (36.4 C) (Oral)   Resp 16   LMP 10/20/2020   SpO2 98%   EFM: Baseline 125 bpm, moderate variability, + accels, no decels   CVE: Dilation: 6 Effacement (%): 80 Station: -2 Presentation: Vertex Exam by:: Dr. Mathis Fare  A&P: 35 y.o. [id number] [redacted]w[redacted]d   #PTL: Unchanged SVE. 1-2 contractions every 30 minutes. Will stop magnesium. 2nd dose of betamethasone to be given now. Will reassess in 2 hours and if patient continues in her current pattern, may consider transfer to antepartum.  #Pain: Epidural in place  #FWB: Cat 1 #ID: S/p Azithromycin; on PCN  #T2DM: CBGs stable with SSI. If patient transfers to antepartum, will plan to transition to home dose NPH.   Worthy Rancher, MD 2:36 PM

## 2021-06-10 NOTE — Op Note (Signed)
Operative Note   06/10/2021  PRE-OP DIAGNOSIS: Cerclage in place. Pregnancy at 33/1   POST-OP DIAGNOSIS: Same  SURGEON: Surgeon(s) and Role:    * Pemberton Bing, MD - Primary  ASSISTANT: None  PROCEDURE:  Exam under anesthesia  ANESTHESIA: Spinal  ESTIMATED BLOOD LOSS: 29mL  DRAINS: indwelling foley  TOTAL IV FLUIDS: per anesthesia note  SPECIMENS: None  VTE PROPHYLAXIS: SCDs to the bilateral lower extremities  ANTIBIOTICS: none  COMPLICATIONS: unable to remove cerclage suture  DISPOSITION: PACU - hemodynamically stable.  CONDITION: stable  FINDINGS: Speculum exam evealed cervix was visually 4-5cm with BBOW and the fetus felt cephalic. On manual exam, cervix felt consistent with speculum exam. I could not feel a suture knot and I could not see a suture knot. I also could not see any evidence of the suture. On manual exam, I felt a thin band that was circumferential around the entirety of the cervix and felt inside the cervical stroma that is likely the suture.  PROCEDURE IN DETAIL:  After informed consent was obtained, the patient was taken to the operating room where anesthesia was obtained without difficulty. The patient was positioned in the dorsal lithotomy position in [place] stirrups, prepped, draped and foley catheter inserted.  A bimanual exam was done. Next a speculum placed in the vagina. The cervix was visualized 360 and where I could feel the thin band I could not see any suture. Given that I did not see any suture or even evidence of the hole where the suture knot initially was to try and feel for the suture, I stopped.    Excellent hemostasis was noted, and all instruments were removed, with excellent hemostasis noted throughout.  She was then taken out of dorsal lithotomy.  The patient tolerated the procedure well.  Sponge, lap and instrument counts were correct x2.  The patient was taken to recovery room in excellent condition.  In [facility] triage, a large portion  of the suture knot was noted, grasped with ringed forceps and pulled up and two suture strands, approximately 70mm in length for each strand, was noted coming down from the knot to both sides of the cervix. One strand was cut, believe this was the entirety of the suture but only the knot pullout along with an extremely small amount of suture material indicating this was likely a knot that had slipped to an air knot, as no air knot was noted in the original op note for the prolene cerclage. Given this, and the fact that I don't feel any knots inside the cervix, there is probably a loose knot or no knot at all keeping the suture material in place. I told the patient I recommend seeing what happens with her preterm labor, in terms of her bleeding and if goes without issue then the suture will likely come through as her cervix changes and will plan to look for it after she delivers the baby and placenta. If she starts to have heavy bleeding from the cervix, then she would need another c-section. Patient amenable to plan. Will transfer to L&D for continued management.   Cornelia Copa MD Attending Center for [employer] Midwife)

## 2021-06-10 NOTE — Progress Notes (Signed)
Labor Progress Note Toni Morgan is a 35 y.o. [phone] at [redacted]w[redacted]d presented for preterm labor. S: feeling well. Pain well controlled with epidural. Had some blood and clots pass with fluid at 0330, which has since stopped. ~1/2 cm cervical change since last check at 2130.  O:  BP 107/65   Pulse 91   Temp 97.7 F (36.5 C) (Oral)   Resp 16   LMP 10/20/2020   SpO2 95%  EFM: 130/good variability/+accels, no decels  CVE: Dilation: 6.5 Effacement (%): 80 Station: -2 Presentation: Vertex Exam by:: Wynelle Bourgeois, CNM   A&P: 35 y.o. [phone] [redacted]w[redacted]d here with preterm labor #Preterm Labor: Continuing to progress. S/p betamethasone x1 and Mg. Bleeding and fluid likely SROM with bloody show. Consider repeat CBC if bleeding becomes more regular. Will continue to monitor.  #Pain: well controlled on combined epidural.  #FWB: cat 1 #GBS  Unknown, s/p ampicillin x1, transitioned to PCN x2 #T2DM: q4 CBG with SSI.   Mabeline Caras, PGY-1, Faculty Service 3:48 AM

## 2021-06-11 LAB — GLUCOSE, CAPILLARY
Glucose-Capillary: 131 mg/dL — ABNORMAL HIGH (ref 70–99)
Glucose-Capillary: 127 mg/dL — ABNORMAL HIGH (ref 70–99)
Glucose-Capillary: 126 mg/dL — ABNORMAL HIGH (ref 70–99)
Glucose-Capillary: 177 mg/dL — ABNORMAL HIGH (ref 70–99)
Glucose-Capillary: 164 mg/dL — ABNORMAL HIGH (ref 70–99)

## 2021-06-11 MED ORDER — CALCIUM CARBONATE ANTACID 500 MG PO CHEW
1.0000 | CHEWABLE_TABLET | Freq: Once | ORAL | Status: AC
  Administered 2021-06-11: 200 mg via ORAL
  Filled 2021-06-11: qty 1

## 2021-06-11 MED ORDER — INSULIN ASPART 100 UNIT/ML IJ SOLN
0.0000 [IU] | INTRAMUSCULAR | Status: DC
  Administered 2021-06-12: 1 [IU] via SUBCUTANEOUS
  Administered 2021-06-12: 2 [IU] via SUBCUTANEOUS
  Administered 2021-06-12 (×2): 1 [IU] via SUBCUTANEOUS

## 2021-06-11 MED ORDER — INSULIN NPH (HUMAN) (ISOPHANE) 100 UNIT/ML ~~LOC~~ SUSP
14.0000 [IU] | Freq: Every day | SUBCUTANEOUS | Status: DC
  Administered 2021-06-11 – 2021-06-12 (×2): 14 [IU] via SUBCUTANEOUS
  Filled 2021-06-11: qty 10

## 2021-06-11 MED ORDER — INSULIN NPH (HUMAN) (ISOPHANE) 100 UNIT/ML ~~LOC~~ SUSP
12.0000 [IU] | Freq: Every day | SUBCUTANEOUS | Status: DC
  Administered 2021-06-12: 12 [IU] via SUBCUTANEOUS
  Filled 2021-06-11: qty 10

## 2021-06-11 NOTE — Progress Notes (Signed)
LABOR PROGRESS NOTE  Toni Morgan is a 35 y.o. [phone] at [redacted]w[redacted]d  presented for preterm labor.  Subjective: Feeling well. She has been feeling an urge to poop. She has not had bowel movement in last 3 days. Continues to pass gas. No other complaints elicited.  Objective: BP 130/78   Pulse 96   Temp 97.8 F (36.6 C) (Oral)   Resp 20   LMP 10/20/2020   SpO2 98%  Vitals:   06/11/21 1030 06/11/21 1100 06/11/21 1132 06/11/21 1201  BP: 119/71 118/76 122/77 130/78  Pulse: 90 95 (!) 105 96  Resp: 16 18 17 20   Temp:    97.8 F (36.6 C)  TempSrc:    Oral  SpO2:       Dilation: 6.5 Effacement (%): 80 Station: -2 Presentation: Vertex Exam by:: J [place] CNM FHT: baseline rate 130, moderate variability, +accels, no decels Toco: q6 min  Labs: Lab Results  Component Value Date   WBC 10.6 (H) 06/09/2021   HGB 10.4 (L) 06/09/2021   HCT 32.7 (L) 06/09/2021   MCV 80.0 06/09/2021   PLT 285 06/09/2021    Patient Active Problem List   Diagnosis Date Noted   Preterm labor 06/09/2021   Short cervical length during pregnancy, second trimester    History of cervical incompetence in pregnancy, currently pregnant    History of cesarean delivery 03/04/2021   Pre-existing diabetes mellitus during pregnancy, antepartum 02/22/2021   Supervision of high risk pregnancy, antepartum 02/14/2021   Obesity (BMI 30.0-34.9) 11/13/2016   Assessment / Plan: 35 y.o. 20 at [redacted]w[redacted]d here for preterm labor.  Labor: No progress made. S/p 2 doses of betamethasone. Considering transfer to antepartum should she continue to be stable without progress. Fetal Wellbeing:  Category I Pain Control:  Epidural in place. Being discontinued currently Anticipated MOD:  vaginal #GBS  Unknown . On PCN. S/p azithromycin Other chronic problems: T2DM on SSI, CBGs q4h. Glucose checks stable in the 120s-140s.  [redacted]w[redacted]d, DO 06/11/2021, 12:14 PM PGY-1, [facility]

## 2021-06-12 ENCOUNTER — Encounter (HOSPITAL_COMMUNITY): Payer: Self-pay | Admitting: Obstetrics and Gynecology

## 2021-06-12 LAB — GLUCOSE, CAPILLARY
Glucose-Capillary: 103 mg/dL — ABNORMAL HIGH (ref 70–99)
Glucose-Capillary: 103 mg/dL — ABNORMAL HIGH (ref 70–99)
Glucose-Capillary: 107 mg/dL — ABNORMAL HIGH (ref 70–99)
Glucose-Capillary: 109 mg/dL — ABNORMAL HIGH (ref 70–99)
Glucose-Capillary: 129 mg/dL — ABNORMAL HIGH (ref 70–99)
Glucose-Capillary: 90 mg/dL (ref 70–99)

## 2021-06-12 MED ORDER — AMOXICILLIN 500 MG PO CAPS
500.0000 mg | ORAL_CAPSULE | Freq: Three times a day (TID) | ORAL | Status: DC
  Administered 2021-06-12 (×2): 500 mg via ORAL
  Filled 2021-06-12 (×3): qty 1

## 2021-06-12 MED ORDER — LACTATED RINGERS IV BOLUS
500.0000 mL | Freq: Once | INTRAVENOUS | Status: AC
  Administered 2021-06-12: 500 mL via INTRAVENOUS

## 2021-06-12 MED ORDER — INSULIN ASPART 100 UNIT/ML IJ SOLN
0.0000 [IU] | Freq: Three times a day (TID) | INTRAMUSCULAR | Status: DC
  Administered 2021-06-12: 1 [IU] via SUBCUTANEOUS

## 2021-06-12 NOTE — Consult Note (Signed)
Consultation Service: Neonatology   Dr. Harolyn Rutherford has asked for consultation on SEINI LANNOM regarding the care of a premature infant at [redacted]w[redacted]d Thank you for inviting [place] see this patient.   Reason for consult:  Explain the possible complications, the prognosis, and the care of a premature infant at 328and 4/7 weeks.  Chief complaint: 35y.o. [phone] with a single IUP with an estimated weight of 1554 grams (last [place] 9/16). Pregnancy has been complicated by PPROM.  Plan is for delivery via vaginal delivery if possible.  My key findings of this patient's HPI are:  I have reviewed the patient's chart and have met with her. The salient information is as follows: Diabetic mother (on insulin) with known cervical incompetence, cerclage, and normal prenatal screening presented in preterm labor. Now with cerclage removed and s/p BMZ and magnesium however contractions have persisted through this AM and she remains dilated at 6.5cm. Threatening preterm labor and given PPROM she will likely be inducted at 34 weeks.  Prenatal labs:  B+ RPR neg HIV neg GBS unk HepB unk    Prenatal care:   good Pregnancy complications:  cervical incompetence, cerclage (now removed, prior c-section and desires TOLAC, and type 2 DM on insulin and metformin . Fetal echo normal Maternal antibiotics: This patient's mother is not on file. Maternal Steroids: Yes Most recent dose:  10/7 at 167  Mom presented in preterm labor   My recommendations for this patient and my actions included:   1. In the presence [facility] and her boyfriend, I spent 30 minutes discussing the possible complications and outcomes of prematurity at this gestational age. I discussed specific complications at this gestational age referencing the need for resuscitation at birth, need for respiratory support for RDS (unlikely), IV fluids pending establishment of enteral feeds (likely reliance on gavage feedings for a  period of time), antibiotics for possible sepsis, temperature support, and monitoring. I also discussed the minimal risk of complications at this gestational age. I discussed this with parents in detail and they expressed an understanding of the risks and complications of prematurity.   2. I also discussed the expected survival of an infant born at 34 weeks which is great. We further discussed that very few of the neonates born at this age have profound or severe neurological complications and school difficulties. Also, few of the neonates born at this age will have some for of mild to moderate neurological complications. She expressed an understanding of this information.   3. I informed her that the NICU team would be present at the delivery. She agreed that all appropriate medical measures could be taken to resuscitate her infant at the delivery.    Final Impression:  35y.o. female with a 34and 4 weeks IUP who is threatening to deliver and who now understands the possible complications and prognosis of her infant. The mother agrees with plan for resuscitation and ICU care. KBrena WindsorHill's questions were answered. She is planning to try and provide breast milk for her infant.    ______________________________________________________________________  Thank you for asking [place] participate in the care of this patient. Please do not hesitate to contact u[place]again if you are aware of any further ways we can be of assistance.   Sincerely,  CEdman Circle MD Attending Neonatologist   I spent ~40 minutes in consultation time, of which 25 minutes was spent in direct face to face counseling.

## 2021-06-12 NOTE — Progress Notes (Signed)
Cervical exam stable for now, no other concerning symptoms.  Category 1 FHR tracing  Will transfer to [facility] for further observation for now.   Jaynie Collins, MD

## 2021-06-12 NOTE — Progress Notes (Signed)
Pt transferred to 116 per w/c

## 2021-06-12 NOTE — Progress Notes (Signed)
Neonatologist called requesting consult

## 2021-06-12 NOTE — Progress Notes (Signed)
    Faculty Practice OB/GYN Attending Note  Subjective:  Called to evaluate patient with frequent contractions, every 2-4 minutes, with increased pain. Mild pressure.  Scant vaginal bleeding. Good FM.   Admitted on 06/09/2021 for Preterm labor.    Objective:  Blood pressure 132/74, pulse 91, temperature 98 F (36.7 C), temperature source Oral, resp. rate 16, last menstrual period 10/20/2020, SpO2 95 %, unknown if currently breastfeeding. FHT  Baseline 150 bpm, moderate variability, +accelerations, + variable/early decelerations Toco: q 2-3 minutes Gen: NAD HENT: Normocephalic, atraumatic Lungs: Normal respiratory effort Heart: Regular rate noted Abdomen: NT gravid fundus, soft Ext: 2+ DTRs, no edema, no cyanosis, negative Homan's sign Cervix: Dilation: 6.5 Effacement (%): 90 Cervical Position: Middle Station: -1 Presentation: Vertex Exam by:: Dr. Macon Large  Assessment & Plan:  35 y.o. [id number] at [redacted]w[redacted]d admitted for preterm labor, stalled at 6.5 cm cervical dilation for now.  Will continue Fentanyl for pain for now, continue to monitor closely. Category 1 FHR tracing. May recheck in 1-2 hours if still contracting.  L&D aware of possible need for transfer later.  Continue close observation on OBSC for now.Marland Kitchen   Jaynie Collins, MD, FACOG Obstetrician & Gynecologist, [facility] for [employer], [facility]

## 2021-06-12 NOTE — Progress Notes (Addendum)
Toni Morgan is a 35 y.o. [id number] at [redacted]w[redacted]d admitted for PTL/PROM.  Subjective: Toni Morgan is feeling more painful contractions. Reports they are every 5 minutes and rates pain 7/10. She otherwise has no other concerns.  Objective: BP 139/78   Pulse 98   Temp 97.6 F (36.4 C)   Resp 20   LMP 10/20/2020   SpO2 98%  I/O last 3 completed shifts: In: -  Out: 3100 [Urine:3100] No intake/output data recorded.  FHT: 135 bpm, moderate variability, + 10x10 accels, no decels UC: Q 5-7 mins SVE:   Dilation: 6.5 Effacement (%): 90 Station: -2, -1 Exam by:: Camelia Eng, CNM  Labs: Lab Results  Component Value Date   WBC 10.6 (H) 06/09/2021   HGB 10.4 (L) 06/09/2021   HCT 32.7 (L) 06/09/2021   MCV 80.0 06/09/2021   PLT 285 06/09/2021    Assessment / Plan: Toni Morgan 35 y.o. [id number] at [redacted]w[redacted]d  Labor: s/p BMZ and Mag. no change from previous exam. Discussed with Dr. Macon Large and will transfer back to [facility].  T2GDM: on insulin. Q4h CBG. Last CBG 90.  Fetal Wellbeing:  Category I Pain Control:  requesting IV pain medications. Has epidural catheter that was capped last night.  I/D:  GBS unknown, on PCN Anticipated MOD:  NSVD    Brand Males, MSN, CNM 06/12/2021, 11:24 AM

## 2021-06-13 ENCOUNTER — Encounter (HOSPITAL_COMMUNITY): Payer: Self-pay | Admitting: Obstetrics and Gynecology

## 2021-06-13 DIAGNOSIS — Z3A33 33 weeks gestation of pregnancy: Secondary | ICD-10-CM

## 2021-06-13 DIAGNOSIS — O34219 Maternal care for unspecified type scar from previous cesarean delivery: Secondary | ICD-10-CM | POA: Diagnosis not present

## 2021-06-13 LAB — TYPE AND SCREEN
ABO/RH(D): B POS
Antibody Screen: NEGATIVE

## 2021-06-13 LAB — CBC WITH DIFFERENTIAL/PLATELET
Abs Immature Granulocytes: 0.13 10*3/uL — ABNORMAL HIGH (ref 0.00–0.07)
Basophils Absolute: 0 10*3/uL (ref 0.0–0.1)
Basophils Relative: 0 %
Eosinophils Absolute: 0.1 10*3/uL (ref 0.0–0.5)
Eosinophils Relative: 0 %
HCT: 30.2 % — ABNORMAL LOW (ref 36.0–46.0)
Hemoglobin: 9.6 g/dL — ABNORMAL LOW (ref 12.0–15.0)
Immature Granulocytes: 1 %
Lymphocytes Relative: 14 %
Lymphs Abs: 1.9 10*3/uL (ref 0.7–4.0)
MCH: 25.5 pg — ABNORMAL LOW (ref 26.0–34.0)
MCHC: 31.8 g/dL (ref 30.0–36.0)
MCV: 80.3 fL (ref 80.0–100.0)
Monocytes Absolute: 1.2 10*3/uL — ABNORMAL HIGH (ref 0.1–1.0)
Monocytes Relative: 9 %
Neutro Abs: 10.4 10*3/uL — ABNORMAL HIGH (ref 1.7–7.7)
Neutrophils Relative %: 76 %
Platelets: 263 10*3/uL (ref 150–400)
RBC: 3.76 MIL/uL — ABNORMAL LOW (ref 3.87–5.11)
RDW: 13.3 % (ref 11.5–15.5)
WBC: 13.7 10*3/uL — ABNORMAL HIGH (ref 4.0–10.5)
nRBC: 0 % (ref 0.0–0.2)

## 2021-06-13 LAB — GLUCOSE, CAPILLARY
Glucose-Capillary: 73 mg/dL (ref 70–99)
Glucose-Capillary: 73 mg/dL (ref 70–99)
Glucose-Capillary: 100 mg/dL — ABNORMAL HIGH (ref 70–99)
Glucose-Capillary: 85 mg/dL (ref 70–99)

## 2021-06-13 MED ORDER — DIPHENHYDRAMINE HCL 50 MG/ML IJ SOLN: 12.5000 mg | INTRAMUSCULAR | Status: DC | PRN

## 2021-06-13 MED ORDER — ZOLPIDEM TARTRATE 5 MG PO TABS: 5.0000 mg | ORAL_TABLET | Freq: Every evening | ORAL | Status: DC | PRN

## 2021-06-13 MED ORDER — SODIUM CHLORIDE 0.9 % IV SOLN
2.0000 g | Freq: Once | INTRAVENOUS | Status: AC
  Administered 2021-06-13: 2 g via INTRAVENOUS
  Filled 2021-06-13: qty 2000

## 2021-06-13 MED ORDER — FENTANYL-BUPIVACAINE-NACL 0.5-0.125-0.9 MG/250ML-% EP SOLN
12.0000 mL/h | EPIDURAL | Status: DC | PRN
  Filled 2021-06-13: qty 250

## 2021-06-13 MED ORDER — TERBUTALINE SULFATE 1 MG/ML IJ SOLN
0.2500 mg | Freq: Once | INTRAMUSCULAR | Status: AC
  Administered 2021-06-13: 0.25 mg via SUBCUTANEOUS
  Filled 2021-06-13: qty 1

## 2021-06-13 MED ORDER — CALCIUM CARBONATE ANTACID 500 MG PO CHEW: 2.0000 | CHEWABLE_TABLET | ORAL | Status: DC | PRN

## 2021-06-13 MED ORDER — LACTATED RINGERS IV SOLN: 500.0000 mL | Freq: Once | INTRAVENOUS | Status: DC

## 2021-06-13 MED ORDER — OXYTOCIN-SODIUM CHLORIDE 30-0.9 UT/500ML-% IV SOLN: 2.5000 [IU]/h | INTRAVENOUS | Status: DC

## 2021-06-13 MED ORDER — FENTANYL CITRATE (PF) 100 MCG/2ML IJ SOLN
50.0000 ug | INTRAMUSCULAR | Status: DC | PRN
  Filled 2021-06-13: qty 2

## 2021-06-13 MED ORDER — SODIUM CHLORIDE 0.9 % IV SOLN
1.0000 g | INTRAVENOUS | Status: DC
  Administered 2021-06-13 (×4): 1 g via INTRAVENOUS
  Filled 2021-06-13 (×7): qty 1000

## 2021-06-13 MED ORDER — ACETAMINOPHEN 325 MG PO TABS: 650.0000 mg | ORAL_TABLET | ORAL | Status: DC | PRN

## 2021-06-13 MED ORDER — PHENYLEPHRINE 40 MCG/ML (10ML) SYRINGE FOR IV PUSH (FOR BLOOD PRESSURE SUPPORT): 80.0000 ug | PREFILLED_SYRINGE | INTRAVENOUS | Status: DC | PRN

## 2021-06-13 MED ORDER — PRENATAL MULTIVITAMIN CH: 1.0000 | ORAL_TABLET | Freq: Every day | ORAL | Status: DC

## 2021-06-13 MED ORDER — DOCUSATE SODIUM 100 MG PO CAPS: 100.0000 mg | ORAL_CAPSULE | Freq: Every day | ORAL | Status: DC

## 2021-06-13 MED ORDER — INSULIN ASPART 100 UNIT/ML IJ SOLN
0.0000 [IU] | INTRAMUSCULAR | Status: DC
  Administered 2021-06-13: 1 [IU] via SUBCUTANEOUS

## 2021-06-13 MED ORDER — OXYTOCIN-SODIUM CHLORIDE 30-0.9 UT/500ML-% IV SOLN
INTRAVENOUS | Status: AC
  Administered 2021-06-13: 333 mL via INTRAVENOUS
  Filled 2021-06-13: qty 500

## 2021-06-13 MED ORDER — FENTANYL-BUPIVACAINE-NACL 0.5-0.125-0.9 MG/250ML-% EP SOLN
EPIDURAL | Status: AC
  Filled 2021-06-13: qty 250

## 2021-06-13 MED ORDER — LACTATED RINGERS IV SOLN: 500.0000 mL | INTRAVENOUS | Status: DC | PRN

## 2021-06-13 MED ORDER — EPHEDRINE 5 MG/ML INJ: 10.0000 mg | INTRAVENOUS | Status: DC | PRN

## 2021-06-13 MED ORDER — LACTATED RINGERS IV SOLN: INTRAVENOUS | Status: DC

## 2021-06-13 MED ORDER — OXYCODONE HCL 5 MG PO TABS
5.0000 mg | ORAL_TABLET | Freq: Four times a day (QID) | ORAL | Status: DC | PRN
  Administered 2021-06-13: 5 mg via ORAL
  Filled 2021-06-13: qty 1

## 2021-06-13 MED ORDER — HYDROXYZINE HCL 50 MG PO TABS: 50.0000 mg | ORAL_TABLET | Freq: Four times a day (QID) | ORAL | Status: DC | PRN

## 2021-06-13 MED ORDER — OXYCODONE-ACETAMINOPHEN 5-325 MG PO TABS: 1.0000 | ORAL_TABLET | ORAL | Status: DC | PRN

## 2021-06-13 MED ORDER — OXYTOCIN BOLUS FROM INFUSION: 333.0000 mL | Freq: Once | INTRAVENOUS | Status: DC

## 2021-06-13 MED ORDER — FLEET ENEMA 7-19 GM/118ML RE ENEM: 1.0000 | ENEMA | Freq: Every day | RECTAL | Status: DC | PRN

## 2021-06-13 NOTE — Progress Notes (Signed)
    Faculty Practice OB/GYN Attending Note  Subjective:  Called to evaluate patient with continued frequent contractions, every 4 minutes, with increased pain and pelvic pressure.  Fentanyl not helping much with pain.  Scant vaginal bleeding. Good FM.   Admitted on 06/09/2021 for Preterm labor.    Objective:  Blood pressure 122/76, pulse 95, temperature 97.7 F (36.5 C), temperature source Axillary, resp. rate 16, last menstrual period 10/20/2020, SpO2 97 %, unknown if currently breastfeeding. FHT  Baseline 150 bpm, moderate variability, +accelerations, + early decelerations Toco: q 2-3 minutes Gen: NAD HENT: Normocephalic, atraumatic Lungs: Normal respiratory effort Heart: Regular rate noted Abdomen: NT gravid fundus, soft Ext: 2+ DTRs, no edema, no cyanosis, negative Homan's sign Cervix: Dilation: 7 Effacement (%): 100 Cervical Position: Middle Station: -1 Presentation: Vertex Exam by:: Dr. Macon Large More cervix palpated on her right.  Assessment & Plan:  35 y.o. [id number] at [redacted]w[redacted]d with preterm labor Making some cervical change and seems to be in active labor Category 1 FHR tracing.  Desires epidural for pain relief Will transfer to L&D now, RN in charge notified Transfer orders placed, will restart Ampicillin for GBS prophylaxis   Jaynie Collins, MD, FACOG Obstetrician & Gynecologist, [facility] for [employer], [facility]

## 2021-06-13 NOTE — Progress Notes (Signed)
Faculty Practice OB/GYN Attending Note  Subjective:  Patient with increased pain despite epidural restarting.  Breathing through contractions.  Scant vaginal bleeding. Good FM.   Admitted on 06/09/2021 for Preterm labor.    Objective:  Blood pressure 125/84, pulse 87, temperature 97.7 F (36.5 C), resp. rate 18, height 5\' 8"  (1.727 m), weight 99.3 kg, last menstrual period 10/20/2020, SpO2 97 %, unknown if currently breastfeeding. FHT  Baseline 145 bpm, moderate variability, +accelerations, + early decelerations Toco: q 2-3 minutes Gen: NAD HENT: Normocephalic, atraumatic Lungs: Normal respiratory effort Heart: Regular rate noted Abdomen: NT gravid fundus, soft Ext: 2+ DTRs, no edema, no cyanosis, negative Homan's sign Cervix: Dilation: 7 Effacement (%): 100 Cervical Position: Middle Station: -1 Presentation: Vertex Exam by:: Dr. [email or web address] More cervical tissue  palpated on her right.  Labs: Results for orders placed or performed during the hospital encounter of 06/09/21 (from the past 24 hour(s))  Glucose, capillary     Status: None   Collection Time: 06/12/21  8:44 AM  Result Value Ref Range   Glucose-Capillary 90 70 - 99 mg/dL  Glucose, capillary     Status: Abnormal   Collection Time: 06/12/21 12:07 PM  Result Value Ref Range   Glucose-Capillary 103 (H) 70 - 99 mg/dL  Glucose, capillary     Status: Abnormal   Collection Time: 06/12/21  3:31 PM  Result Value Ref Range   Glucose-Capillary 109 (H) 70 - 99 mg/dL  Glucose, capillary     Status: Abnormal   Collection Time: 06/12/21  9:36 PM  Result Value Ref Range   Glucose-Capillary 107 (H) 70 - 99 mg/dL  CBC with Differential/Platelet     Status: Abnormal   Collection Time: 06/13/21  4:13 AM  Result Value Ref Range   WBC 13.7 (H) 4.0 - 10.5 K/uL   RBC 3.76 (L) 3.87 - 5.11 MIL/uL   Hemoglobin 9.6 (L) 12.0 - 15.0 g/dL   HCT 08/13/21 (L) 54.6 - 27.0 %   MCV 80.3 80.0 - 100.0 fL   MCH 25.5 (L) 26.0 - 34.0 pg   MCHC 31.8  30.0 - 36.0 g/dL   RDW 35.0 09.3 - 81.8 %   Platelets 263 150 - 400 K/uL   nRBC 0.0 0.0 - 0.2 %   Neutrophils Relative % 76 %   Neutro Abs 10.4 (H) 1.7 - 7.7 K/uL   Lymphocytes Relative 14 %   Lymphs Abs 1.9 0.7 - 4.0 K/uL   Monocytes Relative 9 %   Monocytes Absolute 1.2 (H) 0.1 - 1.0 K/uL   Eosinophils Relative 0 %   Eosinophils Absolute 0.1 0.0 - 0.5 K/uL   Basophils Relative 0 %   Basophils Absolute 0.0 0.0 - 0.1 K/uL   Immature Granulocytes 1 %   Abs Immature Granulocytes 0.13 (H) 0.00 - 0.07 K/uL  Type and screen     Status: None   Collection Time: 06/13/21  4:13 AM  Result Value Ref Range   ABO/RH(D) B POS    Antibody Screen NEG    Sample Expiration      06/16/2021,2359 Performed at [facility], [address], [place], [place]     Assessment & Plan:  35 y.o. [phone] at [redacted]w[redacted]d with preterm labor No significant cervical change since she was transferred back to L&D, but still in significant pain with contractions Category 1 FHR tracing.  No overt signs of infection, maternal/fetal distress or indication for augmentation/induction at this point. Will consult Anesthesiology team  about helping with pain relief Continue Ampicillin for GBS prophylaxis Continue close monitoring here on L&D   Jaynie Collins, MD, FACOG Obstetrician & Gynecologist, [facility] for [employer], [facility]

## 2021-06-13 NOTE — Progress Notes (Signed)
Labor Progress Note BESS SALTZMAN is a 35 y.o. [phone] at [redacted]w[redacted]d presented for preterm labor/PPROM  S: feeling contractions still. No pressure.   O:  BP 127/87   Pulse 92   Temp 97.9 F (36.6 C) (Oral)   Resp 16   Ht 5\' 8"  (1.727 m)   Wt 99.3 kg   LMP 10/20/2020   SpO2 97%   BMI 33.30 kg/m  EFM: 145/mod/15x15/none  Contractions every 5-8 min   CVE: Dilation: 7 Effacement (%): 100 Cervical Position: Middle Station: -1 Presentation: Vertex Exam by:: Dr. [email or web address]   A&P: 35 y.o. 20 [redacted]w[redacted]d  #Labor: Unchanged cervical exam from earlier. Uncomfortable with contractions, but not making change. Discussed with Dr. [redacted]w[redacted]d, will give terb x1 and transfer to antepartum.  #Pain: epidural  #FWB: Cat 1  #GBS unknown, Amp   #T2DM: CBG appropriate. Currently SSI PRN only. On NPH during pregnancy, if re-starts diet may need to add this back on.    Crissie Reese, DO 4:27 PM

## 2021-06-13 NOTE — Progress Notes (Signed)
Labor Progress Note Toni Morgan is a 35 y.o. [id number] at [redacted]w[redacted]d presented for pre-term labor.   S: Feeling well, epidural is in. Not feeling any pressure or contractions.   O:  BP 140/77   Pulse 96   Temp 97.9 F (36.6 C) (Oral)   Resp 16   Ht 5\' 8"  (1.727 m)   Wt 99.3 kg   LMP 10/20/2020   SpO2 97%   BMI 33.30 kg/m  EFM: 145/mod/15x15 No current contractions   CVE: Dilation: 7 Effacement (%): 100 Cervical Position: Middle Station: -1 Presentation: Vertex Exam by:: Dr. [email or web address]   A&P: 35 y.o. 20 [redacted]w[redacted]d. #Labor: Currently comfortable. S/p BMZ. No currently s/sx of infection at this time. Will continue to monitor.  #Pain: Epidural  #FWB: Cat 1 #GBS unknown, receiving Amp. Will collect GBS culture for pediatrics.    [redacted]w[redacted]d, DO 11:10 AM

## 2021-06-13 NOTE — Progress Notes (Signed)
Labor Progress Note Toni Morgan is a 35 y.o. [id number] at [redacted]w[redacted]d presented for pre-term labor.   S: Started having contractions on toco, patient uncomfortable and feeling them in her back.   O:  BP 128/85   Pulse 93   Temp 98 F (36.7 C) (Oral)   Resp 16   Ht 5\' 8"  (1.727 m)   Wt 99.3 kg   LMP 10/20/2020   SpO2 97%   BMI 33.30 kg/m  EFM: 145/mod/15x15/occasional variables   CVE: Dilation: 7 Effacement (%): 100 Cervical Position: Middle Station: -1 Presentation: Vertex Exam by:: Dr. [email or web address]   A&P: 35 y.o. 20 [redacted]w[redacted]d  #Labor: cervix largely unchanged since about 8 hours ago, however just contracting again consistently around 1310. Discussed with Dr. [redacted]w[redacted]d, will continue monitoring contraction pattern for the next hour--pending consistency with either maintain up on L&D or transfer to ante. No s/sx of infection at this time.  #Pain: Epidural in place  #FWB: Cat 1 #GBS unknown, Amp   Crissie Reese, DO 2:04 PM

## 2021-06-13 NOTE — Discharge Summary (Signed)
Postpartum Discharge Summary  Date of Service updated   Patient Name: Toni Morgan DOB: [birth year] MRN: [id number]  Date of admission: 06/09/2021 Delivery date:06/13/2021  Delivering provider: Genia Del  Date of discharge: 06/15/2021  Admitting diagnosis: Preterm labor [O60.00] Intrauterine pregnancy: [redacted]w[redacted]d    Secondary diagnosis:  Principal Problem:   Preterm labor Active Problems:   Obesity (BMI 30.0-34.9)   Pre-existing diabetes mellitus during pregnancy, antepartum   History of cesarean delivery   Short cervical length during pregnancy, second trimester   History of cervical incompetence in pregnancy, currently pregnant   VBAC (vaginal birth after Cesarean)  Additional problems: None    Discharge diagnosis: Preterm Pregnancy Delivered and VBAC                                              Post partum procedures: none Augmentation:  None Complications: None  Hospital course: Onset of Labor With Vaginal Delivery      35y.o. yo [id number] admitted in Active Labor on 06/09/2021. Patient had a cerclage in place with attempted removal in MAU, which was not successful. Patient was taken to the OR on 10/6 for exam under anesthesia to facilitate cerclage removal. Partial stitch removal achieved. Remaining prolene suture within body of cervix. SVE at that 4.5/50. Patient was transferred to L&D for further monitoring as she receiving betamethasone x2, magnesium for tocolysis, and Ampicillin. Patient had SROM at 0330 on 10/7. Treated with Azithromycin and continued on PCN. SVE then 6/80/-2. Given infrequent contractions, patient was eventually transferred to antepartum. She intermittently returned to L&D for increased monitoring when her contractions would become more frequent. On 10/10 around 9:45PM, patient started painfully contracting again with change in cervical exam to 9.5/100. Patient had normal vaginal delivery shortly after arrival to L&D without  complications as below.  Membrane Rupture Time/Date: 3:30 AM ,06/10/2021   Delivery Method:VBAC, Spontaneous  Episiotomy: None  Lacerations:  None   Patient had an uncomplicated postpartum course.  She is ambulating, tolerating a regular diet, passing flatus, and urinating well. Patient is discharged home in stable condition on 06/15/21.  Newborn Data: Birth date:06/13/2021  Birth time:10:17 PM  Gender:Female  Living status:Living  Apgars:7 ,9  Weight:2330 g   Magnesium Sulfate received: Yes: Tocolysis BMZ received: Yes Rhophylac: N/A MMR: N/A - Immune  T-DaP: Given prenatally Flu: No Transfusion: No  Physical exam  Vitals:   06/14/21 1501 06/14/21 1930 06/14/21 2340 06/15/21 0346  BP: 126/82 124/79 123/73 117/88  Pulse: 80 97 80 84  Resp: '18 18 18 18  ' Temp: 97.9 F (36.6 C) 97.7 F (36.5 C) 98.1 F (36.7 C) 97.7 F (36.5 C)  TempSrc: Oral Oral Oral Oral  SpO2: 97% 98% 98% 96%  Weight:      Height:       General: alert, cooperative, and no distress Lochia: appropriate Uterine Fundus: firm Incision: Dressing is clean, dry, and intact DVT Evaluation: No evidence of DVT seen on physical exam.  Labs: Lab Results  Component Value Date   WBC 17.1 (H) 06/14/2021   HGB 9.8 (L) 06/14/2021   HCT 29.9 (L) 06/14/2021   MCV 78.7 (L) 06/14/2021   PLT 279 06/14/2021   CMP Latest Ref Rng & Units 11/29/2020  Glucose 70 - 99 mg/dL 136(H)  BUN 6 - 20 mg/dL 13  Creatinine  0.44 - 1.00 mg/dL 1.10(H)  Sodium 135 - 145 mmol/L 134(L)  Potassium 3.5 - 5.1 mmol/L 4.2  Chloride 98 - 111 mmol/L 103  CO2 22 - 32 mmol/L 27  Calcium 8.9 - 10.3 mg/dL 9.2  Total Protein 6.5 - 8.1 g/dL 6.8  Total Bilirubin 0.3 - 1.2 mg/dL 0.5  Alkaline Phos 38 - 126 U/L 56  AST 15 - 41 U/L 20  ALT 0 - 44 U/L 20   Edinburgh Score: No flowsheet data found.   After visit meds:  Allergies as of 06/15/2021   No Known Allergies      Medication List     STOP taking these medications     Accu-Chek Guide test strip Generic drug: glucose blood   Accu-Chek Softclix Lancets lancets   Comfort Fit Maternity Supp Lg Misc   Dexcom G6 Transmitter Misc   insulin NPH Human 100 UNIT/ML injection Commonly known as: HumuLIN N   Insulin Syringe-Needle U-100 30G X 15/64" 0.5 ML Misc   PRENATAL VITAMINS PO   progesterone 200 MG capsule Commonly known as: Prometrium       TAKE these medications    acetaminophen 500 MG tablet Commonly known as: TYLENOL Take 2 tablets (1,000 mg total) by mouth every 8 (eight) hours.   ibuprofen 800 MG tablet Commonly known as: ADVIL Take 1 tablet (800 mg total) by mouth every 8 (eight) hours.   metFORMIN 500 MG tablet Commonly known as: GLUCOPHAGE Take 1 tablet (500 mg total) by mouth 2 (two) times daily with a meal.   prenatal multivitamin Tabs tablet Take 1 tablet by mouth daily at 12 noon.               Discharge Care Instructions  (From admission, onward)           Start     Ordered   06/15/21 0000  Discharge wound care:       Comments: If dressing is present, remove after 7 days from surgery   06/15/21 0759             Discharge home in stable condition Infant Feeding:  pumping - baby in NICU Infant Disposition:NICU Discharge instruction: per After Visit Summary and Postpartum booklet. Activity: Advance as tolerated. Pelvic rest for 6 weeks.  Diet: carb modified diet and low salt diet Future Appointments: Future Appointments  Date Time Provider [place]  06/21/2021  9:00 AM [facility] NURSE [facility]  06/24/2021  8:20 AM [facility]  07/12/2021 10:35 AM [place], Rona Ravens, NP [facility]   Follow up Visit: Message sent to [facility] by Dr. Gwenlyn Perking on 06/13/21.  Please schedule this patient for a In person postpartum visit in 4 weeks with the following provider: MD. Additional Postpartum F/U: 2 hour GTT and BP check 1 week  *Patient will require pelvic exam at postpartum visit to  attempt removal of remaining cerclage suture.* High risk pregnancy complicated by: GDM, HTN, cervical incompetence, and PTL Delivery mode:  VBAC, Spontaneous  Anticipated Birth Control:  BTL done [facility]   06/15/2021 Radene Gunning, MD

## 2021-06-13 NOTE — Plan of Care (Signed)
  Problem: Education: Goal: Knowledge of General Education information will improve Description: Including pain rating scale, medication(s)/side effects and non-pharmacologic comfort measures Outcome: Progressing   Problem: Health Behavior/Discharge Planning: Goal: Ability to manage health-related needs will improve Outcome: Progressing   Problem: Clinical Measurements: Goal: Ability to maintain clinical measurements within normal limits will improve Outcome: Progressing Goal: Will remain free from infection Outcome: Progressing Goal: Diagnostic test results will improve Outcome: Progressing Goal: Respiratory complications will improve Outcome: Progressing Goal: Cardiovascular complication will be avoided Outcome: Progressing   Problem: Activity: Goal: Risk for activity intolerance will decrease Outcome: Progressing   Problem: Nutrition: Goal: Adequate nutrition will be maintained Outcome: Progressing   Problem: Coping: Goal: Level of anxiety will decrease Outcome: Progressing   Problem: Elimination: Goal: Will not experience complications related to bowel motility Outcome: Progressing Goal: Will not experience complications related to urinary retention Outcome: Progressing   Problem: Pain Managment: Goal: General experience of comfort will improve Outcome: Progressing   Problem: Safety: Goal: Ability to remain free from injury will improve Outcome: Progressing   Problem: Skin Integrity: Goal: Risk for impaired skin integrity will decrease Outcome: Progressing   Problem: Education: Goal: Knowledge of Childbirth will improve Outcome: Progressing Goal: Ability to make informed decisions regarding treatment and plan of care will improve Outcome: Progressing Goal: Ability to state and carry out methods to decrease the pain will improve Outcome: Progressing Goal: Individualized Educational Video(s) Outcome: Progressing   Problem: Coping: Goal: Ability to  verbalize concerns and feelings about labor and delivery will improve Outcome: Progressing   Problem: Life Cycle: Goal: Ability to make normal progression through stages of labor will improve Outcome: Progressing Goal: Ability to effectively push during vaginal delivery will improve Outcome: Progressing   Problem: Role Relationship: Goal: Will demonstrate positive interactions with the child Outcome: Progressing   Problem: Safety: Goal: Risk of complications during labor and delivery will decrease Outcome: Progressing   Problem: Pain Management: Goal: Relief or control of pain from uterine contractions will improve Outcome: Progressing   Problem: Education: Goal: Knowledge of disease or condition will improve Outcome: Progressing Goal: Knowledge of the prescribed therapeutic regimen will improve Outcome: Progressing Goal: Individualized Educational Video(s) Outcome: Progressing   Problem: Clinical Measurements: Goal: Complications related to the disease process, condition or treatment will be avoided or minimized Outcome: Progressing   

## 2021-06-13 NOTE — Progress Notes (Signed)
   CTSP with worsening contraction pain.  Stat cervical exam showed cervix Rim/100/0.  Pt was expedited to labor and delivery due to imminent delivery.  NICU staff and labor nursing staff were alerted. After transfer, pt delivered soon thereafter.  Please see delivery note.   Mariel Aloe, MD

## 2021-06-14 ENCOUNTER — Encounter (HOSPITAL_COMMUNITY)
Admission: AD | Disposition: A | Discharge status: Home / Self Care | Payer: Self-pay | Source: Home / Self Care | Attending: Obstetrics and Gynecology

## 2021-06-14 ENCOUNTER — Inpatient Hospital Stay (HOSPITAL_COMMUNITY): Payer: Managed Care, Other (non HMO) | Admitting: Anesthesiology

## 2021-06-14 ENCOUNTER — Encounter (HOSPITAL_COMMUNITY): Payer: Self-pay | Admitting: Family Medicine

## 2021-06-14 DIAGNOSIS — Z302 Encounter for sterilization: Secondary | ICD-10-CM

## 2021-06-14 HISTORY — PX: TUBAL LIGATION: SHX77

## 2021-06-14 LAB — CBC
HCT: 29.9 % — ABNORMAL LOW (ref 36.0–46.0)
Hemoglobin: 9.8 g/dL — ABNORMAL LOW (ref 12.0–15.0)
MCH: 25.8 pg — ABNORMAL LOW (ref 26.0–34.0)
MCHC: 32.8 g/dL (ref 30.0–36.0)
MCV: 78.7 fL — ABNORMAL LOW (ref 80.0–100.0)
Platelets: 279 10*3/uL (ref 150–400)
RBC: 3.8 MIL/uL — ABNORMAL LOW (ref 3.87–5.11)
RDW: 13.2 % (ref 11.5–15.5)
WBC: 17.1 10*3/uL — ABNORMAL HIGH (ref 4.0–10.5)
nRBC: 0 % (ref 0.0–0.2)

## 2021-06-14 LAB — GLUCOSE, CAPILLARY: Glucose-Capillary: 76 mg/dL (ref 70–99)

## 2021-06-14 SURGERY — LIGATION, FALLOPIAN TUBE, POSTPARTUM
Anesthesia: Spinal | Laterality: Bilateral

## 2021-06-14 MED ORDER — TETANUS-DIPHTH-ACELL PERTUSSIS 5-2.5-18.5 LF-MCG/0.5 IM SUSY: 0.5000 mL | PREFILLED_SYRINGE | Freq: Once | INTRAMUSCULAR | Status: DC

## 2021-06-14 MED ORDER — DIPHENHYDRAMINE HCL 25 MG PO CAPS: 25.0000 mg | ORAL_CAPSULE | Freq: Four times a day (QID) | ORAL | Status: DC | PRN

## 2021-06-14 MED ORDER — BENZOCAINE-MENTHOL 20-0.5 % EX AERO: 1.0000 "application " | INHALATION_SPRAY | CUTANEOUS | Status: DC | PRN

## 2021-06-14 MED ORDER — MEASLES, MUMPS & RUBELLA VAC IJ SOLR: 0.5000 mL | Freq: Once | INTRAMUSCULAR | Status: DC

## 2021-06-14 MED ORDER — BUPIVACAINE IN DEXTROSE 0.75-8.25 % IT SOLN
INTRATHECAL | Status: DC | PRN
  Administered 2021-06-14: 1.8 mL via INTRATHECAL

## 2021-06-14 MED ORDER — ONDANSETRON HCL 4 MG/2ML IJ SOLN
INTRAMUSCULAR | Status: AC
  Filled 2021-06-14: qty 2

## 2021-06-14 MED ORDER — MIDAZOLAM HCL 5 MG/5ML IJ SOLN
INTRAMUSCULAR | Status: DC | PRN
  Administered 2021-06-14: 2 mg via INTRAVENOUS

## 2021-06-14 MED ORDER — STERILE WATER FOR IRRIGATION IR SOLN
Status: DC | PRN
  Administered 2021-06-14: 1000 mL

## 2021-06-14 MED ORDER — KETOROLAC TROMETHAMINE 30 MG/ML IJ SOLN
INTRAMUSCULAR | Status: DC | PRN
  Administered 2021-06-14: 30 mg via INTRAVENOUS

## 2021-06-14 MED ORDER — SENNOSIDES-DOCUSATE SODIUM 8.6-50 MG PO TABS
2.0000 | ORAL_TABLET | Freq: Every day | ORAL | Status: DC
  Filled 2021-06-14: qty 2

## 2021-06-14 MED ORDER — SODIUM CHLORIDE 0.9 % IR SOLN
Status: DC | PRN
  Administered 2021-06-14: 1

## 2021-06-14 MED ORDER — ACETAMINOPHEN 500 MG PO TABS
1000.0000 mg | ORAL_TABLET | Freq: Three times a day (TID) | ORAL | Status: DC
  Administered 2021-06-14 – 2021-06-15 (×3): 1000 mg via ORAL
  Filled 2021-06-14 (×3): qty 2

## 2021-06-14 MED ORDER — MEDROXYPROGESTERONE ACETATE 150 MG/ML IM SUSP: 150.0000 mg | INTRAMUSCULAR | Status: DC | PRN

## 2021-06-14 MED ORDER — SIMETHICONE 80 MG PO CHEW: 80.0000 mg | CHEWABLE_TABLET | ORAL | Status: DC | PRN

## 2021-06-14 MED ORDER — WITCH HAZEL-GLYCERIN EX PADS: 1.0000 "application " | MEDICATED_PAD | CUTANEOUS | Status: DC | PRN

## 2021-06-14 MED ORDER — COCONUT OIL OIL: 1.0000 "application " | TOPICAL_OIL | Status: DC | PRN

## 2021-06-14 MED ORDER — ONDANSETRON HCL 4 MG PO TABS: 4.0000 mg | ORAL_TABLET | ORAL | Status: DC | PRN

## 2021-06-14 MED ORDER — LACTATED RINGERS IV SOLN: INTRAVENOUS | Status: DC

## 2021-06-14 MED ORDER — DIBUCAINE (PERIANAL) 1 % EX OINT: 1.0000 "application " | TOPICAL_OINTMENT | CUTANEOUS | Status: DC | PRN

## 2021-06-14 MED ORDER — IBUPROFEN 600 MG PO TABS
600.0000 mg | ORAL_TABLET | Freq: Four times a day (QID) | ORAL | Status: DC
  Administered 2021-06-14: 600 mg via ORAL
  Filled 2021-06-14: qty 1

## 2021-06-14 MED ORDER — ONDANSETRON HCL 4 MG/2ML IJ SOLN: 4.0000 mg | INTRAMUSCULAR | Status: DC | PRN

## 2021-06-14 MED ORDER — PRENATAL MULTIVITAMIN CH: 1.0000 | ORAL_TABLET | Freq: Every day | ORAL | Status: DC

## 2021-06-14 MED ORDER — OXYCODONE HCL 5 MG PO TABS
10.0000 mg | ORAL_TABLET | ORAL | Status: DC | PRN
Start: 2021-06-14 — End: 2021-06-15

## 2021-06-14 MED ORDER — BUPIVACAINE HCL (PF) 0.25 % IJ SOLN
INTRAMUSCULAR | Status: AC
  Filled 2021-06-14: qty 30

## 2021-06-14 MED ORDER — DOCUSATE SODIUM 100 MG PO CAPS
100.0000 mg | ORAL_CAPSULE | Freq: Two times a day (BID) | ORAL | Status: DC
  Administered 2021-06-14: 100 mg via ORAL
  Filled 2021-06-14: qty 1

## 2021-06-14 MED ORDER — HYDROMORPHONE HCL 1 MG/ML IJ SOLN: 0.2500 mg | INTRAMUSCULAR | Status: DC | PRN

## 2021-06-14 MED ORDER — FENTANYL CITRATE (PF) 100 MCG/2ML IJ SOLN
INTRAMUSCULAR | Status: DC | PRN
  Administered 2021-06-14: 15 ug via INTRATHECAL

## 2021-06-14 MED ORDER — OXYCODONE HCL 5 MG/5ML PO SOLN
5.0000 mg | Freq: Once | ORAL | Status: DC | PRN
Start: 2021-06-14 — End: 2021-06-14

## 2021-06-14 MED ORDER — PROMETHAZINE HCL 25 MG/ML IJ SOLN: 6.2500 mg | INTRAMUSCULAR | Status: DC | PRN

## 2021-06-14 MED ORDER — MIDAZOLAM HCL 2 MG/2ML IJ SOLN
INTRAMUSCULAR | Status: AC
  Filled 2021-06-14: qty 2

## 2021-06-14 MED ORDER — COCONUT OIL OIL
1.0000 "application " | TOPICAL_OIL | Status: DC | PRN
  Administered 2021-06-14: 1 via TOPICAL

## 2021-06-14 MED ORDER — IBUPROFEN 800 MG PO TABS
800.0000 mg | ORAL_TABLET | Freq: Three times a day (TID) | ORAL | Status: DC
  Administered 2021-06-14 – 2021-06-15 (×2): 800 mg via ORAL
  Filled 2021-06-14 (×2): qty 1

## 2021-06-14 MED ORDER — OXYCODONE HCL 5 MG PO TABS
5.0000 mg | ORAL_TABLET | ORAL | Status: DC | PRN
Start: 2021-06-14 — End: 2021-06-15

## 2021-06-14 MED ORDER — MEPERIDINE HCL 25 MG/ML IJ SOLN: 6.2500 mg | INTRAMUSCULAR | Status: DC | PRN

## 2021-06-14 MED ORDER — METFORMIN HCL 500 MG PO TABS
500.0000 mg | ORAL_TABLET | Freq: Two times a day (BID) | ORAL | Status: DC
  Administered 2021-06-15: 250 mg via ORAL
  Filled 2021-06-14 (×2): qty 1

## 2021-06-14 MED ORDER — BUPIVACAINE HCL (PF) 0.25 % IJ SOLN
INTRAMUSCULAR | Status: DC | PRN
  Administered 2021-06-14: 30 mL

## 2021-06-14 MED ORDER — FENTANYL CITRATE (PF) 100 MCG/2ML IJ SOLN
INTRAMUSCULAR | Status: DC | PRN
  Administered 2021-06-14: 85 ug via INTRAVENOUS

## 2021-06-14 MED ORDER — KETOROLAC TROMETHAMINE 30 MG/ML IJ SOLN
INTRAMUSCULAR | Status: AC
  Filled 2021-06-14: qty 1

## 2021-06-14 MED ORDER — LACTATED RINGERS IV SOLN
INTRAVENOUS | Status: DC | PRN
Start: 2021-06-14 — End: 2021-06-14

## 2021-06-14 MED ORDER — ACETAMINOPHEN 325 MG PO TABS: 650.0000 mg | ORAL_TABLET | ORAL | Status: DC | PRN

## 2021-06-14 MED ORDER — PRENATAL MULTIVITAMIN CH
1.0000 | ORAL_TABLET | Freq: Every day | ORAL | Status: DC
  Administered 2021-06-14: 1 via ORAL
  Filled 2021-06-14: qty 1

## 2021-06-14 MED ORDER — ONDANSETRON HCL 4 MG/2ML IJ SOLN
INTRAMUSCULAR | Status: DC | PRN
  Administered 2021-06-14: 4 mg via INTRAVENOUS

## 2021-06-14 MED ORDER — FENTANYL CITRATE (PF) 100 MCG/2ML IJ SOLN
INTRAMUSCULAR | Status: AC
  Filled 2021-06-14: qty 2

## 2021-06-14 MED ORDER — OXYCODONE HCL 5 MG PO TABS: 5.0000 mg | ORAL_TABLET | Freq: Once | ORAL | Status: DC | PRN

## 2021-06-14 MED ORDER — HYDROCHLOROTHIAZIDE 25 MG PO TABS: 25.0000 mg | ORAL_TABLET | Freq: Every day | ORAL | Status: DC

## 2021-06-14 MED ORDER — SENNOSIDES-DOCUSATE SODIUM 8.6-50 MG PO TABS
2.0000 | ORAL_TABLET | ORAL | Status: DC
  Administered 2021-06-14: 2 via ORAL
  Filled 2021-06-14: qty 2

## 2021-06-14 MED ORDER — KETOROLAC TROMETHAMINE 30 MG/ML IJ SOLN: 30.0000 mg | Freq: Once | INTRAMUSCULAR | Status: DC | PRN

## 2021-06-14 SURGICAL SUPPLY — 23 items
BLADE SURG 11 STRL SS (BLADE) ×2 IMPLANT
CLIP FILSHIE TUBAL LIGA STRL (Clip) ×1 IMPLANT
DRESSING OPSITE X SMALL 2X3 (GAUZE/BANDAGES/DRESSINGS) ×1 IMPLANT
DRSG OPSITE POSTOP 3X4 (GAUZE/BANDAGES/DRESSINGS) ×2 IMPLANT
DURAPREP 26ML APPLICATOR (WOUND CARE) ×2 IMPLANT
GLOVE BIOGEL PI IND STRL 7.0 (GLOVE) ×1 IMPLANT
GLOVE BIOGEL PI IND STRL 7.5 (GLOVE) ×1 IMPLANT
GLOVE BIOGEL PI INDICATOR 7.0 (GLOVE) ×1
GLOVE BIOGEL PI INDICATOR 7.5 (GLOVE) ×1
GLOVE ECLIPSE 7.5 STRL STRAW (GLOVE) ×2 IMPLANT
GOWN STRL REUS W/TWL LRG LVL3 (GOWN DISPOSABLE) ×4 IMPLANT
HIBICLENS CHG 4% 4OZ BTL (MISCELLANEOUS) ×2 IMPLANT
LIGASURE IMPACT 36 18CM CVD LR (INSTRUMENTS) ×1 IMPLANT
NEEDLE HYPO 22GX1.5 SAFETY (NEEDLE) ×2 IMPLANT
NS IRRIG 1000ML POUR BTL (IV SOLUTION) ×2 IMPLANT
PACK ABDOMINAL MINOR (CUSTOM PROCEDURE TRAY) ×2 IMPLANT
PROTECTOR NERVE ULNAR (MISCELLANEOUS) ×2 IMPLANT
SPONGE LAP 4X18 RFD (DISPOSABLE) IMPLANT
SUT VICRYL 0 UR6 27IN ABS (SUTURE) ×2 IMPLANT
SUT VICRYL 4-0 PS2 18IN ABS (SUTURE) ×2 IMPLANT
SYR CONTROL 10ML LL (SYRINGE) ×2 IMPLANT
TOWEL OR 17X24 6PK STRL BLUE (TOWEL DISPOSABLE) ×4 IMPLANT
TRAY FOLEY W/BAG SLVR 14FR (SET/KITS/TRAYS/PACK) ×2 IMPLANT

## 2021-06-14 NOTE — Anesthesia Procedure Notes (Signed)
Spinal  Patient location during procedure: OR Start time: 06/14/2021 11:27 AM End time: 06/14/2021 11:30 AM Reason for block: surgical anesthesia Staffing Performed: anesthesiologist  Anesthesiologist: Lannie Fields, DO Preanesthetic Checklist Completed: patient identified, IV checked, risks and benefits discussed, surgical consent, monitors and equipment checked, pre-op evaluation and timeout performed Spinal Block Patient position: sitting Prep: DuraPrep and site prepped and draped Patient monitoring: cardiac monitor, continuous pulse ox and blood pressure Approach: midline Location: L3-4 Injection technique: single-shot Needle Needle type: Pencan  Needle gauge: 24 G Needle length: 9 cm Assessment Sensory level: T6 Events: CSF return Additional Notes Functioning IV was confirmed and monitors were applied. Sterile prep and drape, including hand hygiene and sterile gloves were used. The patient was positioned and the spine was prepped. The skin was anesthetized with lidocaine.  Free flow of clear CSF was obtained prior to injecting local anesthetic into the CSF.  The spinal needle aspirated freely following injection.  The needle was carefully withdrawn.  The patient tolerated the procedure well.

## 2021-06-14 NOTE — Progress Notes (Signed)
Post Partum Day 1 Subjective: Patient is doing well without complaints. She is ambulating and voiding. Patient has been NPO in preparation for BTL  Objective: Blood pressure 126/84, pulse 86, temperature 98 F (36.7 C), temperature source Oral, resp. rate 18, height 5\' 8"  (1.727 m), weight 99.3 kg, last menstrual period 10/20/2020, SpO2 98 %, unknown if currently breastfeeding.  Physical Exam:  General: alert, cooperative, and no distress Lochia: appropriate Uterine Fundus: firm Incision: n/a DVT Evaluation: No evidence of DVT seen on physical exam.  Recent Labs    06/13/21 0413 06/14/21 0505  HGB 9.6* 9.8*  HCT 30.2* 29.9*    Assessment/Plan: Patient PPD#1  - Scheduled for BTL today. Patientdesires permanent sterilization. Risks and benefits of procedure discussed with patient including permanence of method, bleeding, infection, injury to surrounding organs and need for additional procedures. Risk failure of 0.5-1% with increased risk of ectopic gestation if pregnancy occurs was also discussed with patient.   - Continue monitoring CBG  - Continue routine post partum care   LOS: 5 days   Jaysiah Marchetta 06/14/2021, 9:23 AM

## 2021-06-14 NOTE — Progress Notes (Signed)
Patient screened out for psychosocial assessment since none of the following apply: °Psychosocial stressors documented in mother or baby's chart °Gestation less than 32 weeks °Code at delivery  °Infant with anomalies °Please contact the Clinical Social Worker if specific needs arise, by MOB's request, or if MOB scores greater than 9/yes to question 10 on Edinburgh Postpartum Depression Screen. ° °Castor Gittleman Boyd-Gilyard, MSW, LCSW °Clinical Social Work °[phone] °  °

## 2021-06-14 NOTE — Anesthesia Postprocedure Evaluation (Signed)
Anesthesia Post Note  Patient: Toni Morgan  Procedure(s) Performed: POST PARTUM TUBAL LIGATION (Bilateral)     Patient location during evaluation: PACU Anesthesia Type: Spinal Level of consciousness: awake and alert and oriented Pain management: pain level controlled Vital Signs Assessment: post-procedure vital signs reviewed and stable Respiratory status: spontaneous breathing, nonlabored ventilation and respiratory function stable Cardiovascular status: blood pressure returned to baseline and stable Postop Assessment: no headache, no backache, spinal receding and no apparent nausea or vomiting Anesthetic complications: no   No notable events documented.  Last Vitals:  Vitals:   06/14/21 1239 06/14/21 1240  BP:    Pulse: 77 80  Resp: 17 16  Temp:    SpO2: 100% 99%    Last Pain:  Vitals:   06/14/21 0909  TempSrc: Oral  PainSc:    Pain Goal: Patients Stated Pain Goal: 5 (06/12/21 2130)                 Lannie Fields

## 2021-06-14 NOTE — Anesthesia Preprocedure Evaluation (Addendum)
Anesthesia Evaluation  Patient identified by MRN, date of birth, ID band Patient awake    Reviewed: Allergy & Precautions, NPO status , Patient's Chart, lab work & pertinent test results  Airway Mallampati: III  TM Distance: >3 FB Neck ROM: Full    Dental no notable dental hx. (+) Teeth Intact, Dental Advisory Given   Pulmonary neg pulmonary ROS,    Pulmonary exam normal breath sounds clear to auscultation       Cardiovascular negative cardio ROS Normal cardiovascular exam Rhythm:Regular Rate:Normal     Neuro/Psych negative neurological ROS  negative psych ROS   GI/Hepatic negative GI ROS, Neg liver ROS,   Endo/Other  diabetes, Well Controlled, Type 2, Insulin DependentObesity BMI 33  Renal/GU negative Renal ROS  negative genitourinary   Musculoskeletal negative musculoskeletal ROS (+)   Abdominal (+) + obese,   Peds negative pediatric ROS (+)  Hematology  (+) Blood dyscrasia, anemia , hct 29.9, plt 279   Anesthesia Other Findings   Reproductive/Obstetrics Desires sterility                            Anesthesia Physical Anesthesia Plan  ASA: 3  Anesthesia Plan: Spinal   Post-op Pain Management:    Induction:   PONV Risk Score and Plan: 3 and Ondansetron, Dexamethasone and Treatment may vary due to age or medical condition  Airway Management Planned: Natural Airway  Additional Equipment: None  Intra-op Plan:   Post-operative Plan:   Informed Consent: I have reviewed the patients History and Physical, chart, labs and discussed the procedure including the risks, benefits and alternatives for the proposed anesthesia with the patient or authorized representative who has indicated his/her understanding and acceptance.     Dental advisory given  Plan Discussed with: CRNA  Anesthesia Plan Comments:       Anesthesia Quick Evaluation

## 2021-06-14 NOTE — Op Note (Signed)
Operative Note   Toni Morgan   SURGERY DATE: 06/14/2021  PRE-OP DIAGNOSIS: Multiparity, undesired Fertility  POST-OP DIAGNOSIS: Same   PROCEDURE: Postpartum bilateral salpingectomy using Ligasure   SURGEON: Surgeon(s) and Role:    * Levie Heritage, DO - Primary    * Simcha Farrington, Hiram Comber, DO - Assisting  ASSISTANT: [name], DO   ANESTHESIA: epidural  COMPLICATIONS:  None immediate.  ESTIMATED BLOOD LOSS:  Less than 10 ml.  FLUIDS: 1000 ml LR.  URINE OUTPUT:  75 ml of clear urine.  INDICATIONS: 36 y.o. [id number]  with undesired fertility,status post vaginal delivery, desires permanent sterilization. Risks and benefits of procedure discussed with patient including permanence of method, bleeding, infection, injury to surrounding organs and need for additional procedures. Risk failure of 0.5-1% with increased risk of ectopic gestation if pregnancy occurs was also discussed with patient.   FINDINGS:  Normal uterus, tubes, and ovaries.  PATHOLOGY: Bilateral sections of tube including fimbriae.  TECHNIQUE:  The patient was taken to the operating room where her epidural anesthesia was dosed up to surgical level and found to be adequate.  She was then placed in the dorsal supine position and prepped and draped in sterile fashion.  After an adequate timeout was performed, attention was turned to the patient's abdomen where a small transverse skin incision was made under the umbilical fold. The incision was taken down to the layer of fascia using the scalpel, and fascia was incised, and extended bilaterally using Mayo scissors. The peritoneum was entered in a sharp fashion. Attention was then turned to the patient's uterus.   The right fallopian tube was located and identified to the fimbriae. The Ligasure was placed under the tube and fimbriae over the mesosalpinx and ligated and cut in sections until most of tube was excised, leaving a pedicle. Good hemostasis was noted.  Attention was turned to the left tube and the same thing was repeated. Good hemostasis was noted.   Good hemostasis was noted overall. The instruments were then removed from the patient's abdomen and the fascial incision was repaired with 0 Vicryl, the subcutaneous layer closed with 2-0 plaingut, and the skin was closed with a 4-0 Monocryl subcuticular stitch. The patient tolerated the procedure well.  Sponge, lap, and needle counts were correct times two.  The patient was then taken to the recovery room awake, alert and breathing independently in stable condition.  [name], DO [facility] for [employer], [facility] 06/14/2021  12:21 PM

## 2021-06-14 NOTE — Anesthesia Postprocedure Evaluation (Signed)
Anesthesia Post Note  Patient: Toni Morgan  Procedure(s) Performed: AN AD HOC LABOR EPIDURAL     Patient location during evaluation: OB High Risk Anesthesia Type: Epidural Level of consciousness: oriented, awake and awake and alert Pain management: pain level controlled Vital Signs Assessment: post-procedure vital signs reviewed and stable Respiratory status: spontaneous breathing, respiratory function stable and nonlabored ventilation Cardiovascular status: stable Postop Assessment: no headache, adequate PO intake, able to ambulate, patient able to bend at knees, no apparent nausea or vomiting and no backache Anesthetic complications: no   No notable events documented.  Last Vitals:  Vitals:   06/14/21 0125 06/14/21 0508  BP: 125/81 122/85  Pulse: 84 88  Resp: 16 16  Temp: 36.7 C 36.8 C  SpO2: 99% 98%    Last Pain:  Vitals:   06/14/21 0612  TempSrc:   PainSc: 0-No pain   Pain Goal: Patients Stated Pain Goal: 5 (06/12/21 2130)                 [name]

## 2021-06-14 NOTE — Transfer of Care (Signed)
Immediate Anesthesia Transfer of Care Note  Patient: Toni Morgan  Procedure(s) Performed: POST PARTUM TUBAL LIGATION (Bilateral)  Patient Location: PACU  Anesthesia Type:Spinal  Level of Consciousness: awake and alert   Airway & Oxygen Therapy: Patient Spontanous Breathing  Post-op Assessment: Report given to RN  Post vital signs: Reviewed  Last Vitals:  Vitals Value Taken Time  BP 122/77 06/14/21 1224  Temp 37 C 06/14/21 1225  Pulse 79 06/14/21 1225  Resp 17 06/14/21 1225  SpO2 99 % 06/14/21 1225  Vitals shown include unvalidated device data.  Last Pain:  Vitals:   06/14/21 0909  TempSrc: Oral  PainSc:       Patients Stated Pain Goal: 5 (06/12/21 2130)  Complications: No notable events documented.

## 2021-06-14 NOTE — Lactation Note (Signed)
This note was copied from a baby's chart. Lactation Consultation Note  Patient Name: Toni Morgan [id number] Date: 06/14/2021 Reason for consult: Initial assessment;NICU baby;Preterm <34wks;Maternal endocrine disorder Age:34 hours  Visited with mom of 33 6/7 weeks (adjusted) NICU female, she's a P2 and reported moderate breast changes during the pregnancy. LC assisted mom with hand expression and her first pumping session, pump was already set up in the room but she reported she didn't pump earlier because she was still recovering from her surgery this morning, she had a tubal ligation.   Reviewed pumping schedule, guidelines, expectations, lactogenesis II and benefits of premature milk.   Plan of care:  Encouraged mom to start pumping consistently every 3 hours; at least 8 pumping sessions/24 hours Hand expression, breast massage and coconut oil were also encouraged prior pumping  FOB present and supportive. All questions and concerns answered, parents to call NICU LC PRN.  Maternal Data Has patient been taught Hand Expression?: Yes Does the patient have breastfeeding experience prior to this delivery?: Yes How long did the patient breastfeed?: 1 month  Feeding Mother's Current Feeding Choice: Breast Milk  Lactation Tools Discussed/Used Tools: Pump;Flanges;Coconut oil Flange Size: 24 Breast pump type: Double-Electric Breast Pump Pump Education: Setup, frequency, and cleaning;Milk Storage Reason for Pumping: pre-term NICU infant Pumping frequency: q 3 hours (recommended) Pumped volume:  (drops)  Interventions Interventions: Breast feeding basics reviewed;Breast massage;Hand express;Coconut oil;DEBP;Education;"The NICU and Your Baby" book;LC Services brochure  Discharge Pump: DEBP;Personal (DEBP from her insurance) WIC Program: Yes  Consult Status Consult Status: Follow-up Date: 06/14/21 Follow-up type: In-patient    Reegan Bouffard Venetia Constable 06/14/2021, 2:24 PM

## 2021-06-15 ENCOUNTER — Other Ambulatory Visit (HOSPITAL_COMMUNITY): Payer: Self-pay

## 2021-06-15 LAB — CULTURE, BETA STREP (GROUP B ONLY)

## 2021-06-15 LAB — SURGICAL PATHOLOGY

## 2021-06-15 MED ORDER — PRENATAL MULTIVITAMIN CH: 1.0000 | ORAL_TABLET | Freq: Every day | ORAL | Status: AC

## 2021-06-15 MED ORDER — ACETAMINOPHEN 500 MG PO TABS
1000.0000 mg | ORAL_TABLET | Freq: Three times a day (TID) | ORAL | 0 refills | Status: AC
  Filled 2021-06-15: qty 30, 5d supply, fill #0

## 2021-06-15 MED ORDER — IBUPROFEN 800 MG PO TABS
800.0000 mg | ORAL_TABLET | Freq: Three times a day (TID) | ORAL | 0 refills | Status: DC
  Filled 2021-06-15: qty 30, 10d supply, fill #0

## 2021-06-15 MED ORDER — METFORMIN HCL 500 MG PO TABS
500.0000 mg | ORAL_TABLET | Freq: Two times a day (BID) | ORAL | 1 refills | Status: AC
  Filled 2021-06-15: qty 60, 30d supply, fill #0

## 2021-06-15 NOTE — Plan of Care (Signed)
  Problem: Education: Goal: Knowledge of General Education information will improve Description: Including pain rating scale, medication(s)/side effects and non-pharmacologic comfort measures Outcome: Completed/Met   Problem: Health Behavior/Discharge Planning: Goal: Ability to manage health-related needs will improve Outcome: Completed/Met   Problem: Clinical Measurements: Goal: Ability to maintain clinical measurements within normal limits will improve Outcome: Completed/Met Goal: Will remain free from infection Outcome: Completed/Met Goal: Diagnostic test results will improve Outcome: Completed/Met Goal: Respiratory complications will improve Outcome: Completed/Met Goal: Cardiovascular complication will be avoided Outcome: Completed/Met   Problem: Activity: Goal: Risk for activity intolerance will decrease Outcome: Completed/Met   Problem: Nutrition: Goal: Adequate nutrition will be maintained Outcome: Completed/Met   Problem: Coping: Goal: Level of anxiety will decrease Outcome: Completed/Met   Problem: Elimination: Goal: Will not experience complications related to bowel motility Outcome: Completed/Met Goal: Will not experience complications related to urinary retention Outcome: Completed/Met   Problem: Pain Managment: Goal: General experience of comfort will improve Outcome: Completed/Met   Problem: Safety: Goal: Ability to remain free from injury will improve Outcome: Completed/Met   Problem: Skin Integrity: Goal: Risk for impaired skin integrity will decrease Outcome: Completed/Met   Problem: Education: Goal: Knowledge of Childbirth will improve Outcome: Completed/Met Goal: Ability to make informed decisions regarding treatment and plan of care will improve Outcome: Completed/Met Goal: Ability to state and carry out methods to decrease the pain will improve Outcome: Completed/Met Goal: Individualized Educational Video(s) Outcome: Completed/Met    Problem: Coping: Goal: Ability to verbalize concerns and feelings about labor and delivery will improve Outcome: Completed/Met   Problem: Life Cycle: Goal: Ability to make normal progression through stages of labor will improve Outcome: Completed/Met Goal: Ability to effectively push during vaginal delivery will improve Outcome: Completed/Met   Problem: Role Relationship: Goal: Will demonstrate positive interactions with the child Outcome: Completed/Met   Problem: Safety: Goal: Risk of complications during labor and delivery will decrease Outcome: Completed/Met   Problem: Pain Management: Goal: Relief or control of pain from uterine contractions will improve Outcome: Completed/Met   Problem: Education: Goal: Knowledge of disease or condition will improve Outcome: Completed/Met Goal: Knowledge of the prescribed therapeutic regimen will improve Outcome: Completed/Met Goal: Individualized Educational Video(s) Outcome: Completed/Met   Problem: Clinical Measurements: Goal: Complications related to the disease process, condition or treatment will be avoided or minimized Outcome: Completed/Met

## 2021-06-17 ENCOUNTER — Ambulatory Visit: Payer: Managed Care, Other (non HMO)

## 2021-06-17 ENCOUNTER — Ambulatory Visit: Payer: Self-pay

## 2021-06-17 NOTE — Lactation Note (Addendum)
This note was copied from a baby's chart. Lactation Consultation Note Mother's milk is in and she complains of breast fullness. LC provided ice/heat packs/handsfree top and assisted with pumping. Encouraged mother to continue pumping routine with ice/heat today q 2-3 to avoid engorgement. Larger flanges provided prn.   1800: LC returned to room to issue [facility] loaner pump.    Patient Name: Toni Morgan [id number] Date: 06/17/2021 Reason for consult: Follow-up assessment;Mother's request Age:33 days  Maternal Data  Tools: Hands-free pumping top Flange Size: 27. Provided 59mm to use prn. Pumping frequency: q3 Pumped volume: 20 mL  Feeding Mother's Current Feeding Choice: Breast Milk   Interventions Interventions: Ice;Education   Consult Status Consult Status: Follow-up Date: 06/17/21 Follow-up type: In-patient   Elder Negus 06/17/2021, 10:20 AM

## 2021-06-17 NOTE — Lactation Note (Signed)
This note was copied from a baby's chart. Lactation Consultation Note LC briefly spoke with mother on 06/15/2021. She was pumping frequently and without difficulty. F/u visit planned to further assess and assist prn.  Patient Name: Toni Morgan [id number] Date: 06/17/2021    Elder Negus 06/17/2021, 10:16 AM

## 2021-06-18 ENCOUNTER — Encounter: Payer: Self-pay | Admitting: Radiology

## 2021-06-20 ENCOUNTER — Ambulatory Visit: Payer: Self-pay

## 2021-06-20 NOTE — Lactation Note (Signed)
This note was copied from a baby's chart.  NICU Lactation Consultation Note  Patient Name: Toni Morgan [id number] Date: 06/20/2021 Age:35 years   Subjective Reason for consult: Follow-up assessment; Engorgement Mother has stopped pumping. She has elected to bottle feed. She has some remaining breast fullness.  Mother complains of headache x 2 days.  Objective Infant data: Mother's Current Feeding Choice: Breast Milk  Infant feeding assessment Scale for Readiness: 2    Maternal data: [id number]  VBAC, Spontaneous   Assessment  Maternal: Mother is at risk for engorgement because of abrupt discontinuance of pumping.  Intervention/Plan Interventions: Education; Ice  No data recorded Plan: Consult Status: Follow-up  LC encouraged mother to pump prn to relieve over fullness while drying breast milk .  Mother to offer any EBM to baby LC will plan f/u to assist mother with avoiding engorgement as her milk drys.  Mother to f/u with MAU regarding headache.  LC advised infant's RN of mother's complaint.   Elder Negus 06/20/2021, 11:32 AM

## 2021-06-21 ENCOUNTER — Ambulatory Visit: Payer: Managed Care, Other (non HMO)

## 2021-06-22 ENCOUNTER — Other Ambulatory Visit: Payer: Managed Care, Other (non HMO)

## 2021-06-22 ENCOUNTER — Encounter: Payer: Managed Care, Other (non HMO) | Admitting: Family Medicine

## 2021-06-24 ENCOUNTER — Ambulatory Visit (INDEPENDENT_AMBULATORY_CARE_PROVIDER_SITE_OTHER): Payer: Managed Care, Other (non HMO)

## 2021-06-24 ENCOUNTER — Other Ambulatory Visit: Payer: Managed Care, Other (non HMO)

## 2021-06-24 ENCOUNTER — Other Ambulatory Visit: Payer: Self-pay

## 2021-06-24 ENCOUNTER — Encounter (HOSPITAL_COMMUNITY): Payer: Self-pay | Admitting: Obstetrics and Gynecology

## 2021-06-24 ENCOUNTER — Inpatient Hospital Stay (HOSPITAL_COMMUNITY)
Admission: AD | Admit: 2021-06-24 | Discharge: 2021-06-26 | DRG: 776 | Disposition: A | Discharge status: Home / Self Care | Payer: Managed Care, Other (non HMO) | Attending: Obstetrics & Gynecology | Admitting: Obstetrics & Gynecology

## 2021-06-24 VITALS — BP 157/111 | Wt 202.8 lb

## 2021-06-24 DIAGNOSIS — Z20822 Contact with and (suspected) exposure to covid-19: Secondary | ICD-10-CM | POA: Diagnosis present

## 2021-06-24 DIAGNOSIS — O24319 Unspecified pre-existing diabetes mellitus in pregnancy, unspecified trimester: Secondary | ICD-10-CM

## 2021-06-24 DIAGNOSIS — Z7984 Long term (current) use of oral hypoglycemic drugs: Secondary | ICD-10-CM

## 2021-06-24 DIAGNOSIS — O2413 Pre-existing diabetes mellitus, type 2, in the puerperium: Secondary | ICD-10-CM | POA: Diagnosis present

## 2021-06-24 DIAGNOSIS — O1495 Unspecified pre-eclampsia, complicating the puerperium: Principal | ICD-10-CM | POA: Diagnosis present

## 2021-06-24 DIAGNOSIS — E119 Type 2 diabetes mellitus without complications: Secondary | ICD-10-CM | POA: Diagnosis present

## 2021-06-24 DIAGNOSIS — Z0130 Encounter for examination of blood pressure without abnormal findings: Secondary | ICD-10-CM

## 2021-06-24 LAB — COMPREHENSIVE METABOLIC PANEL
ALT: 51 U/L — ABNORMAL HIGH (ref 0–44)
ALT: 46 U/L — ABNORMAL HIGH (ref 0–44)
AST: 22 U/L (ref 15–41)
AST: 16 U/L (ref 15–41)
Albumin: 3.3 g/dL — ABNORMAL LOW (ref 3.5–5.0)
Albumin: 3.4 g/dL — ABNORMAL LOW (ref 3.5–5.0)
Alkaline Phosphatase: 82 U/L (ref 38–126)
Alkaline Phosphatase: 92 U/L (ref 38–126)
Anion gap: 10 (ref 5–15)
Anion gap: 8 (ref 5–15)
BUN: 12 mg/dL (ref 6–20)
BUN: 9 mg/dL (ref 6–20)
CO2: 24 mmol/L (ref 22–32)
CO2: 28 mmol/L (ref 22–32)
Calcium: 9.2 mg/dL (ref 8.9–10.3)
Calcium: 8.2 mg/dL — ABNORMAL LOW (ref 8.9–10.3)
Chloride: 104 mmol/L (ref 98–111)
Chloride: 102 mmol/L (ref 98–111)
Creatinine, Ser: 1.07 mg/dL — ABNORMAL HIGH (ref 0.44–1.00)
Creatinine, Ser: 0.97 mg/dL (ref 0.44–1.00)
GFR, Estimated: >60 mL/min (ref >60)
GFR, Estimated: >60 mL/min (ref >60)
Glucose, Bld: 147 mg/dL — ABNORMAL HIGH (ref 70–99)
Glucose, Bld: 147 mg/dL — ABNORMAL HIGH (ref 70–99)
Potassium: 3.6 mmol/L (ref 3.5–5.1)
Potassium: 3.3 mmol/L — ABNORMAL LOW (ref 3.5–5.1)
Sodium: 138 mmol/L (ref 135–145)
Sodium: 138 mmol/L (ref 135–145)
Total Bilirubin: 0.5 mg/dL (ref 0.3–1.2)
Total Bilirubin: 0.7 mg/dL (ref 0.3–1.2)
Total Protein: 6.9 g/dL (ref 6.5–8.1)
Total Protein: 7 g/dL (ref 6.5–8.1)

## 2021-06-24 LAB — CBC
HCT: 38.7 % (ref 36.0–46.0)
Hemoglobin: 11.8 g/dL — ABNORMAL LOW (ref 12.0–15.0)
MCH: 24.7 pg — ABNORMAL LOW (ref 26.0–34.0)
MCHC: 30.5 g/dL (ref 30.0–36.0)
MCV: 81.1 fL (ref 80.0–100.0)
Platelets: 352 10*3/uL (ref 150–400)
RBC: 4.77 MIL/uL (ref 3.87–5.11)
RDW: 13.6 % (ref 11.5–15.5)
WBC: 8.9 10*3/uL (ref 4.0–10.5)
nRBC: 0 % (ref 0.0–0.2)

## 2021-06-24 LAB — RESP PANEL BY RT-PCR (FLU A&B, COVID) ARPGX2
Influenza A by PCR: NEGATIVE
Influenza B by PCR: NEGATIVE
SARS Coronavirus 2 by RT PCR: NEGATIVE

## 2021-06-24 LAB — MAGNESIUM: Magnesium: 5.8 mg/dL — ABNORMAL HIGH (ref 1.7–2.4)

## 2021-06-24 MED ORDER — LABETALOL HCL 5 MG/ML IV SOLN
20.0000 mg | INTRAVENOUS | Status: DC | PRN
  Administered 2021-06-24: 20 mg via INTRAVENOUS
  Filled 2021-06-24: qty 4

## 2021-06-24 MED ORDER — LABETALOL HCL 5 MG/ML IV SOLN
40.0000 mg | INTRAVENOUS | Status: DC | PRN
  Administered 2021-06-24: 40 mg via INTRAVENOUS
  Filled 2021-06-24: qty 8

## 2021-06-24 MED ORDER — MAGNESIUM SULFATE 40 GM/1000ML IV SOLN
2.0000 g/h | INTRAVENOUS | Status: AC
  Administered 2021-06-24 – 2021-06-25 (×2): 2 g/h via INTRAVENOUS
  Filled 2021-06-24 (×2): qty 1000

## 2021-06-24 MED ORDER — LACTATED RINGERS IV SOLN: INTRAVENOUS | Status: DC

## 2021-06-24 MED ORDER — ACETAMINOPHEN 500 MG PO TABS
1000.0000 mg | ORAL_TABLET | Freq: Three times a day (TID) | ORAL | Status: DC
  Administered 2021-06-24 – 2021-06-25 (×4): 1000 mg via ORAL
  Filled 2021-06-24 (×5): qty 2

## 2021-06-24 MED ORDER — LABETALOL HCL 5 MG/ML IV SOLN: 20.0000 mg | INTRAVENOUS | Status: DC | PRN

## 2021-06-24 MED ORDER — NIFEDIPINE ER OSMOTIC RELEASE 30 MG PO TB24
30.0000 mg | ORAL_TABLET | Freq: Every day | ORAL | Status: DC
  Administered 2021-06-24: 30 mg via ORAL
  Filled 2021-06-24: qty 1

## 2021-06-24 MED ORDER — HYDRALAZINE HCL 20 MG/ML IJ SOLN: 10.0000 mg | INTRAMUSCULAR | Status: DC | PRN

## 2021-06-24 MED ORDER — LABETALOL HCL 5 MG/ML IV SOLN: 40.0000 mg | INTRAVENOUS | Status: DC | PRN

## 2021-06-24 MED ORDER — LABETALOL HCL 5 MG/ML IV SOLN: 80.0000 mg | INTRAVENOUS | Status: DC | PRN

## 2021-06-24 MED ORDER — PRENATAL MULTIVITAMIN CH
1.0000 | ORAL_TABLET | Freq: Every day | ORAL | Status: DC
  Administered 2021-06-24 – 2021-06-25 (×2): 1 via ORAL
  Filled 2021-06-24 (×2): qty 1

## 2021-06-24 MED ORDER — MAGNESIUM SULFATE BOLUS VIA INFUSION
4.0000 g | Freq: Once | INTRAVENOUS | Status: AC
  Administered 2021-06-24: 4 g via INTRAVENOUS
  Filled 2021-06-24: qty 1000

## 2021-06-24 MED ORDER — FUROSEMIDE 40 MG PO TABS
20.0000 mg | ORAL_TABLET | Freq: Two times a day (BID) | ORAL | Status: DC
  Administered 2021-06-24 – 2021-06-26 (×4): 20 mg via ORAL
  Filled 2021-06-24 (×5): qty 1

## 2021-06-24 MED ORDER — METFORMIN HCL 500 MG PO TABS
500.0000 mg | ORAL_TABLET | Freq: Two times a day (BID) | ORAL | Status: DC
  Administered 2021-06-24 – 2021-06-25 (×3): 500 mg via ORAL
  Filled 2021-06-24 (×4): qty 1

## 2021-06-24 NOTE — MAU Provider Note (Signed)
History     CSN: [id number]  Arrival date and time: 06/24/21 6333   Event Date/Time   First Provider Initiated Contact with Patient 06/24/21 0945      Chief Complaint  Patient presents with   Hypertension   Headache   HPI Toni Morgan is a 35 y.o. [id number] at 10 days post partum who presents from the office for BP evaluation. Had repeat c/section at [redacted]w[redacted]d.  Denies history of hypertension. Had severe headache the last few days that has since resolved. Reports currently has headache that she rates 1/10.  Denies visual disturbance or epigastric pain.   OB History     Gravida  4   Para  2   Term  1   Preterm  1   AB  2   Living  2      SAB  1   IAB  0   Ectopic      Multiple  0   Live Births  2           Past Medical History:  Diagnosis Date   Abnormal Pap smear of cervix    H/O colposcopy with cervical biopsy    pt reports she needs LEEP but it is on hold due to pregnancy   Type 2 diabetes mellitus (HCC) 02/14/2021    Past Surgical History:  Procedure Laterality Date   CERVICAL CERCLAGE N/A 03/05/2021   Procedure: CERCLAGE CERVICAL;  Surgeon: Tereso Newcomer, MD;  Location: [facility];  Service: Gynecology;  Laterality: N/A;   CERVICAL CERCLAGE Bilateral 06/09/2021   Procedure: CERCLAGE CERVICAL REMOVAL   ;  Surgeon: [place] Bing, MD;  Location: [facility];  Service: Gynecology;  Laterality: Bilateral;   CESAREAN SECTION N/A 07/02/2013   Procedure: CESAREAN SECTION;  Surgeon: Lavina Hamman, MD;  Location: [facility] ORS;  Service: Obstetrics;  Laterality: N/A;   COLPOSCOPY     HERNIA REPAIR  2002   TUBAL LIGATION Bilateral 06/14/2021   Procedure: POST PARTUM TUBAL LIGATION;  Surgeon: Levie Heritage, DO;  Location: [facility];  Service: Gynecology;  Laterality: Bilateral;   WISDOM TOOTH EXTRACTION      Family History  Problem Relation Age of Onset   Hyperlipidemia Mother    Hypertension Mother    Ovarian cysts Sister    Diabetes Maternal Uncle     Cancer Maternal Grandmother     Social History   Tobacco Use   Smoking status: Never   Smokeless tobacco: Never  Vaping Use   Vaping Use: Never used  Substance Use Topics   Alcohol use: Not Currently    Comment: occ before pos upt but none now   Drug use: No    Allergies: No Known Allergies  Medications Prior to Admission  Medication Sig Dispense Refill Last Dose   acetaminophen (TYLENOL) 500 MG tablet Take 2 tablets (1,000 mg total) by mouth every 8 (eight) hours. 30 tablet 0    ibuprofen (ADVIL) 800 MG tablet Take 1 tablet (800 mg total) by mouth every 8 (eight) hours. (Patient not taking: Reported on 06/24/2021) 30 tablet 0    metFORMIN (GLUCOPHAGE) 500 MG tablet Take 1 tablet (500 mg total) by mouth 2 (two) times daily with a meal. 60 tablet 1    Prenatal Vit-Fe Fumarate-FA (PRENATAL MULTIVITAMIN) TABS tablet Take 1 tablet by mouth daily at 12 noon.       Review of Systems  Constitutional: Negative.   Gastrointestinal: Negative.   Genitourinary: Negative.  Neurological:  Positive for headaches.  Physical Exam   Blood pressure (!) 162/105, pulse 86, temperature 97.8 F (36.6 C), temperature source Oral, resp. rate 16, SpO2 99 %, not currently breastfeeding.  Patient Vitals for the past 24 hrs:  BP Temp Temp src Pulse Resp SpO2  06/24/21 1130 (!) 158/101 -- -- 79 -- --  06/24/21 1121 (!) 163/102 -- -- 80 -- --  06/24/21 1111 (!) 159/105 -- -- 83 -- --  06/24/21 1100 (!) 151/99 -- -- 80 -- --  06/24/21 1050 (!) 147/103 -- -- 83 -- --  06/24/21 1041 (!) 150/100 -- -- 83 -- --  06/24/21 1030 (!) 153/100 -- -- 84 -- --  06/24/21 1021 (!) 147/100 -- -- 84 -- --  06/24/21 1011 (!) 166/104 -- -- 89 -- --  06/24/21 1000 (!) 163/101 -- -- 84 -- --  06/24/21 0942 (!) 162/105 97.8 F (36.6 C) Oral 86 16 99 %    Physical Exam Vitals and nursing note reviewed.  Constitutional:      Appearance: She is well-developed. She is not ill-appearing.  HENT:     Head:  Normocephalic and atraumatic.  Eyes:     General: No scleral icterus. Cardiovascular:     Rate and Rhythm: Normal rate and regular rhythm.     Heart sounds: Normal heart sounds.  Pulmonary:     Effort: Pulmonary effort is normal. No respiratory distress.     Breath sounds: Normal breath sounds.  Musculoskeletal:     Right lower leg: No edema.     Left lower leg: No edema.  Skin:    General: Skin is warm and dry.  Neurological:     Mental Status: She is alert.     Cranial Nerves: No cranial nerve deficit.     Deep Tendon Reflexes: Reflexes normal.     Comments: No clonus  Psychiatric:        Mood and Affect: Mood normal.        Behavior: Behavior normal.    MAU Course  Procedures Results for orders placed or performed during the hospital encounter of 06/24/21 (from the past 24 hour(s))  Comprehensive metabolic panel     Status: Abnormal   Collection Time: 06/24/21  9:59 AM  Result Value Ref Range   Sodium 138 135 - 145 mmol/L   Potassium 3.6 3.5 - 5.1 mmol/L   Chloride 104 98 - 111 mmol/L   CO2 24 22 - 32 mmol/L   Glucose, Bld 147 (H) 70 - 99 mg/dL   BUN 12 6 - 20 mg/dL   Creatinine, Ser 3.66 (H) 0.44 - 1.00 mg/dL   Calcium 9.2 8.9 - 29.4 mg/dL   Total Protein 6.9 6.5 - 8.1 g/dL   Albumin 3.3 (L) 3.5 - 5.0 g/dL   AST 22 15 - 41 U/L   ALT 51 (H) 0 - 44 U/L   Alkaline Phosphatase 82 38 - 126 U/L   Total Bilirubin 0.5 0.3 - 1.2 mg/dL   GFR, Estimated >76 >54 mL/min   Anion gap 10 5 - 15  CBC     Status: Abnormal   Collection Time: 06/24/21  9:59 AM  Result Value Ref Range   WBC 8.9 4.0 - 10.5 K/uL   RBC 4.77 3.87 - 5.11 MIL/uL   Hemoglobin 11.8 (L) 12.0 - 15.0 g/dL   HCT 65.0 35.4 - 65.6 %   MCV 81.1 80.0 - 100.0 fL   MCH 24.7 (L) 26.0 - 34.0 pg  MCHC 30.5 30.0 - 36.0 g/dL   RDW 68.1 59.4 - 70.7 %   Platelets 352 150 - 400 K/uL   nRBC 0.0 0.0 - 0.2 %    MDM Patient at 10 days post partum presents with new onset hypertension. Severe range BPs treated with 3  doses of IV antihypertensive. Mildly elevated ALT at 51. Creatinine elevated - previously elevated 6 months ago.  Reviewed with Dr. Para March. Will admit for magnesium.   Assessment and Plan   1. Preeclampsia in postpartum period    -admit to [facility] for magnesium  Judeth Horn 06/24/2021, 9:45 AM

## 2021-06-24 NOTE — Progress Notes (Signed)
Blood Pressure Check Visit  Toni Morgan is here for blood pressure check following spontaneous vaginal delivery on 06/13/21. Patient had elevated BP during admission for delivery without diagnosis of gestational hypertension; BP on 06/13/21 141/70. Not discharged on any BP med. BP values today:  0827:    169/108  0900:    138/109  0905:    157/111  Patient denies any symptoms of elevated BP.  Reviewed with Crissie Reese, MD who states pt should present to MAU for evaluation due to severe range BP.  Pt also completing 2 HR GTT today. Fasting glucose drawn at 0830. 75 g glucose drink completed at 0832; will need 2 HR glucose drawn at 1032. Report given to MAU provider Judeth Horn, NP by Crissie Reese, MD.  Marjo Bicker, RN 06/24/2021  8:15 AM

## 2021-06-24 NOTE — Plan of Care (Signed)
  Problem: Education: Goal: Knowledge of General Education information will improve Description: Including pain rating scale, medication(s)/side effects and non-pharmacologic comfort measures Outcome: Completed/Met

## 2021-06-24 NOTE — MAU Note (Signed)
Toni Morgan is a 35 y.o. here in MAU reporting: s/p SVD on 10/10, was in the office for BP check today and was sent over to MAU for further evaluation. Has a very mild headache. No visual changes or RUQ pain.   Onset of complaint: today  Pain score: 1/10  Vitals:   06/24/21 0942  BP: (!) 162/105  Pulse: 86  Resp: 16  Temp: 97.8 F (36.6 C)  SpO2: 99%     Lab orders placed from triage: none

## 2021-06-24 NOTE — H&P (Signed)
[facility]ACT[facility] ANTEPARTUM ADMISSION HISTORY AND PHYSICAL NOTE   History of Present Illness: Toni Morgan is a 35 y.o. [phone] who is about 11d postpartum admitted for PP preeclampsia based on BP and HA. She went for BP check in the office and she had elevated blood pressures in the s/o HA so shew was sent to the MAU. There she had elevated Bps that were severe and Labetalol given. However, given the HA and persistent Bps, we recommended admission for magnesium and initiation of blood pressure medication.   Patient Active Problem List   Diagnosis Date Noted   Preeclampsia in postpartum period 06/24/2021   VBAC (vaginal birth after Cesarean) 06/13/2021   Preterm labor 06/09/2021   Short cervical length during pregnancy, second trimester    History of cervical incompetence in pregnancy, currently pregnant    History of cesarean delivery 03/04/2021   Pre-existing diabetes mellitus during pregnancy, antepartum 02/22/2021   Supervision of high risk pregnancy, antepartum 02/14/2021   Obesity (BMI 30.0-34.9) 11/13/2016    Past Medical History:  Diagnosis Date   Abnormal Pap smear of cervix    H/O colposcopy with cervical biopsy    pt reports she needs LEEP but it is on hold due to pregnancy   Type 2 diabetes mellitus (HCC) 02/14/2021    Past Surgical History:  Procedure Laterality Date   CERVICAL CERCLAGE N/A 03/05/2021   Procedure: CERCLAGE CERVICAL;  Surgeon: Tereso Newcomer, MD;  Location: [facility];  Service: Gynecology;  Laterality: N/A;   CERVICAL CERCLAGE Bilateral 06/09/2021   Procedure: CERCLAGE CERVICAL REMOVAL   ;  Surgeon: Hudson Bing, MD;  Location: [facility];  Service: Gynecology;  Laterality: Bilateral;   CESAREAN SECTION N/A 07/02/2013   Procedure: CESAREAN SECTION;  Surgeon: Lavina Hamman, MD;  Location: [facility] ORS;  Service: Obstetrics;  Laterality: N/A;   COLPOSCOPY     HERNIA REPAIR  2002   TUBAL LIGATION Bilateral 06/14/2021   Procedure: POST PARTUM TUBAL  LIGATION;  Surgeon: Levie Heritage, DO;  Location: [facility];  Service: Gynecology;  Laterality: Bilateral;   WISDOM TOOTH EXTRACTION      OB History  Gravida Para Term Preterm AB Living  4 2 1 1 2 2   SAB IAB Ectopic Multiple Live Births  1 0   0 2    # Outcome Date GA Lbr Len/2nd Weight Sex Delivery Anes PTL Lv  4 Preterm 06/13/21 [redacted]w[redacted]d  2330 g F VBAC None  LIV  3 SAB 09/2020 [redacted]w[redacted]d         2 AB 2017          1 Term 07/02/13 [redacted]w[redacted]d   M CS-LTranv EPI  LIV     Birth Comments: GDM; c/s breech    Social History   Socioeconomic History   Marital status: Single    Spouse name: Not on file   Number of children: Not on file   Years of education: Not on file   Highest education level: Not on file  Occupational History   Not on file  Tobacco Use   Smoking status: Never   Smokeless tobacco: Never  Vaping Use   Vaping Use: Never used  Substance and Sexual Activity   Alcohol use: Not Currently    Comment: occ before pos upt but none now   Drug use: No   Sexual activity: Not Currently    Birth control/protection: None  Other Topics Concern   Not on file  Social History Narrative  Not on file   Social Determinants of Health   Financial Resource Strain: Not on file  Food Insecurity: No Food Insecurity   Worried About Running Out of Food in the Last Year: Never true   Ran Out of Food in the Last Year: Never true  Transportation Needs: No Transportation Needs   Lack of Transportation (Medical): No   Lack of Transportation (Non-Medical): No  Physical Activity: Not on file  Stress: Not on file  Social Connections: Not on file    Family History  Problem Relation Age of Onset   Hyperlipidemia Mother    Hypertension Mother    Ovarian cysts Sister    Diabetes Maternal Uncle    Cancer Maternal Grandmother     No Known Allergies  Medications Prior to Admission  Medication Sig Dispense Refill Last Dose   acetaminophen (TYLENOL) 500 MG tablet Take 2 tablets (1,000 mg  total) by mouth every 8 (eight) hours. 30 tablet 0 06/24/2021   metFORMIN (GLUCOPHAGE) 500 MG tablet Take 1 tablet (500 mg total) by mouth 2 (two) times daily with a meal. 60 tablet 1 06/24/2021   Prenatal Vit-Fe Fumarate-FA (PRENATAL MULTIVITAMIN) TABS tablet Take 1 tablet by mouth daily at 12 noon.   06/24/2021    Review of Systems - Negative except HA  Vitals:  BP (!) 158/101   Pulse 79   Temp 97.8 F (36.6 C) (Oral)   Resp 16   SpO2 99%   Breastfeeding No  Physical Examination: CONSTITUTIONAL: Well-developed, well-nourished female in no acute distress.  HENT:  Normocephalic, atraumatic, External right and left ear normal. Oropharynx is clear and moist EYES: Conjunctivae and EOM are normal. Pupils are equal, round, and reactive to light. No scleral icterus.  NECK: Normal range of motion, supple, no masses SKIN: Skin is warm and dry. No rash noted. Not diaphoretic. No erythema. No pallor. NEUROLOGIC: Alert and oriented to person, place, and time. Normal reflexes, muscle tone coordination. No cranial nerve deficit noted. PSYCHIATRIC: Normal mood and affect. Normal behavior. Normal judgment and thought content. CARDIOVASCULAR: Normal heart rate noted, regular rhythm RESPIRATORY: Effort and breath sounds normal, no problems with respiration noted ABDOMEN: Soft, nontender, nondistended, gravid. MUSCULOSKELETAL: Normal range of motion. No edema and no tenderness. 2+ distal pulses.  Labs:  Results for orders placed or performed during the hospital encounter of 06/24/21 (from the past 24 hour(s))  Comprehensive metabolic panel   Collection Time: 06/24/21  9:59 AM  Result Value Ref Range   Sodium 138 135 - 145 mmol/L   Potassium 3.6 3.5 - 5.1 mmol/L   Chloride 104 98 - 111 mmol/L   CO2 24 22 - 32 mmol/L   Glucose, Bld 147 (H) 70 - 99 mg/dL   BUN 12 6 - 20 mg/dL   Creatinine, Ser 0.09 (H) 0.44 - 1.00 mg/dL   Calcium 9.2 8.9 - 38.1 mg/dL   Total Protein 6.9 6.5 - 8.1 g/dL   Albumin  3.3 (L) 3.5 - 5.0 g/dL   AST 22 15 - 41 U/L   ALT 51 (H) 0 - 44 U/L   Alkaline Phosphatase 82 38 - 126 U/L   Total Bilirubin 0.5 0.3 - 1.2 mg/dL   GFR, Estimated >82 >99 mL/min   Anion gap 10 5 - 15  CBC   Collection Time: 06/24/21  9:59 AM  Result Value Ref Range   WBC 8.9 4.0 - 10.5 K/uL   RBC 4.77 3.87 - 5.11 MIL/uL   Hemoglobin 11.8 (L) 12.0 -  15.0 g/dL   HCT 82.4 23.5 - 36.1 %   MCV 81.1 80.0 - 100.0 fL   MCH 24.7 (L) 26.0 - 34.0 pg   MCHC 30.5 30.0 - 36.0 g/dL   RDW 44.3 15.4 - 00.8 %   Platelets 352 150 - 400 K/uL   nRBC 0.0 0.0 - 0.2 %    Assessment and Plan: Patient Active Problem List   Diagnosis Date Noted   Preeclampsia in postpartum period 06/24/2021   VBAC (vaginal birth after Cesarean) 06/13/2021   Preterm labor 06/09/2021   Short cervical length during pregnancy, second trimester    History of cervical incompetence in pregnancy, currently pregnant    History of cesarean delivery 03/04/2021   Pre-existing diabetes mellitus during pregnancy, antepartum 02/22/2021   Supervision of high risk pregnancy, antepartum 02/14/2021   Obesity (BMI 30.0-34.9) 11/13/2016   Postpartum Preeclampsia - Magnesium for 24 hours from initiation - Started Procardia 30 daily but will titrate accordingly - Lasix started as well - Cr is 1.07, but this seems consistent with her history of about 1.0 for Cr. Mild elevated in her ALT. Will recheck labs including Mg level as a precaution 12 hours from now and recheck tomorrow morning.   Pre-existing DM - Was doing her 2 hr at the office today but unfortunately the testign was interrupted due to her blood pressures and ultimately will need to be repeated.  - Metformin will be resumed from home medication and she can reschedule once discharged from the hospital from this admission  3. Routine postpartum care - Baby is in the NICU per report. She is bottlefeeding  Milas Hock, MD, FACOG Attending Obstetrician & Gynecologist Faculty  Practice, [facility]

## 2021-06-24 NOTE — Progress Notes (Signed)
Chart reviewed for nurse visit. Agree with plan of care.   Shabree Tebbetts M, MD 06/24/21 12:03 PM 

## 2021-06-25 DIAGNOSIS — E119 Type 2 diabetes mellitus without complications: Secondary | ICD-10-CM | POA: Diagnosis present

## 2021-06-25 DIAGNOSIS — Z7984 Long term (current) use of oral hypoglycemic drugs: Secondary | ICD-10-CM | POA: Diagnosis not present

## 2021-06-25 DIAGNOSIS — O1495 Unspecified pre-eclampsia, complicating the puerperium: Secondary | ICD-10-CM | POA: Diagnosis present

## 2021-06-25 DIAGNOSIS — O2413 Pre-existing diabetes mellitus, type 2, in the puerperium: Secondary | ICD-10-CM | POA: Diagnosis present

## 2021-06-25 DIAGNOSIS — Z20822 Contact with and (suspected) exposure to covid-19: Secondary | ICD-10-CM | POA: Diagnosis present

## 2021-06-25 LAB — COMPREHENSIVE METABOLIC PANEL
ALT: 40 U/L (ref 0–44)
AST: 21 U/L (ref 15–41)
Albumin: 3.3 g/dL — ABNORMAL LOW (ref 3.5–5.0)
Alkaline Phosphatase: 88 U/L (ref 38–126)
Anion gap: 9 (ref 5–15)
BUN: 10 mg/dL (ref 6–20)
CO2: 25 mmol/L (ref 22–32)
Calcium: 7.4 mg/dL — ABNORMAL LOW (ref 8.9–10.3)
Chloride: 101 mmol/L (ref 98–111)
Creatinine, Ser: 0.86 mg/dL (ref 0.44–1.00)
GFR, Estimated: >60 mL/min (ref >60)
Glucose, Bld: 107 mg/dL — ABNORMAL HIGH (ref 70–99)
Potassium: 3.5 mmol/L (ref 3.5–5.1)
Sodium: 135 mmol/L (ref 135–145)
Total Bilirubin: 0.6 mg/dL (ref 0.3–1.2)
Total Protein: 6.9 g/dL (ref 6.5–8.1)

## 2021-06-25 LAB — GLUCOSE, FASTING: Glucose, Plasma: 74 mg/dL (ref 70–99)

## 2021-06-25 MED ORDER — NIFEDIPINE ER OSMOTIC RELEASE 60 MG PO TB24
60.0000 mg | ORAL_TABLET | Freq: Two times a day (BID) | ORAL | Status: DC
  Administered 2021-06-25 (×2): 60 mg via ORAL
  Filled 2021-06-25 (×2): qty 1

## 2021-06-25 NOTE — Progress Notes (Signed)
Patient ID: Toni Morgan, female   DOB: 01-13-1986, 35 y.o.   MRN: [id number]   Assessment/Plan: Active Problems:   Preeclampsia in postpartum period  She is -2L BP could use better control - on Lasix, increased Procardia to 60 bid--will monitor after Magnesium is complete Labs are improving, repeat this am Continue Magnesium x 24 hours  Subjective: Interval History:feels well  Objective: Vital signs in last 24 hours: Temp:  [97.6 F (36.4 C)-97.8 F (36.6 C)] 97.8 F (36.6 C) (10/22 0349) Pulse Rate:  [72-99] 93 (10/22 0349) Resp:  [16-19] 18 (10/22 0630) BP: (128-169)/(80-111) 139/91 (10/22 0349) SpO2:  [95 %-99 %] 98 % (10/22 0349) Weight:  [91.6 kg-92 kg] 91.6 kg (10/21 1213)  Intake/Output from previous day: 10/21 0701 - 10/22 0700 In: 3111.8 [P.O.:1260; I.V.:1851.8] Out: 5250 [Urine:5250] Intake/Output this shift: No intake/output data recorded.  General appearance: alert, cooperative, and appears stated age Head: Normocephalic, without obvious abnormality, atraumatic Neck: supple, symmetrical, trachea midline Lungs: normal effort Heart: regular rate and rhythm Abdomen: soft, non-tender; bowel sounds normal; no masses,  no organomegaly Extremities: extremities normal, atraumatic, no cyanosis or edema  Results for orders placed or performed during the hospital encounter of 06/24/21 (from the past 24 hour(s))  Comprehensive metabolic panel     Status: Abnormal   Collection Time: 06/24/21  9:59 AM  Result Value Ref Range   Sodium 138 135 - 145 mmol/L   Potassium 3.6 3.5 - 5.1 mmol/L   Chloride 104 98 - 111 mmol/L   CO2 24 22 - 32 mmol/L   Glucose, Bld 147 (H) 70 - 99 mg/dL   BUN 12 6 - 20 mg/dL   Creatinine, Ser 2.37 (H) 0.44 - 1.00 mg/dL   Calcium 9.2 8.9 - 62.8 mg/dL   Total Protein 6.9 6.5 - 8.1 g/dL   Albumin 3.3 (L) 3.5 - 5.0 g/dL   AST 22 15 - 41 U/L   ALT 51 (H) 0 - 44 U/L   Alkaline Phosphatase 82 38 - 126 U/L   Total Bilirubin 0.5 0.3 - 1.2 mg/dL    GFR, Estimated >31 >51 mL/min   Anion gap 10 5 - 15  CBC     Status: Abnormal   Collection Time: 06/24/21  9:59 AM  Result Value Ref Range   WBC 8.9 4.0 - 10.5 K/uL   RBC 4.77 3.87 - 5.11 MIL/uL   Hemoglobin 11.8 (L) 12.0 - 15.0 g/dL   HCT 76.1 60.7 - 37.1 %   MCV 81.1 80.0 - 100.0 fL   MCH 24.7 (L) 26.0 - 34.0 pg   MCHC 30.5 30.0 - 36.0 g/dL   RDW 06.2 69.4 - 85.4 %   Platelets 352 150 - 400 K/uL   nRBC 0.0 0.0 - 0.2 %  Resp Panel by RT-PCR (Flu A&B, Covid) Nasopharyngeal Swab     Status: None   Collection Time: 06/24/21 11:46 AM   Specimen: Nasopharyngeal Swab; Nasopharyngeal(NP) swabs in vial transport medium  Result Value Ref Range   SARS Coronavirus 2 by RT PCR NEGATIVE NEGATIVE   Influenza A by PCR NEGATIVE NEGATIVE   Influenza B by PCR NEGATIVE NEGATIVE  Comprehensive metabolic panel     Status: Abnormal   Collection Time: 06/24/21  9:59 PM  Result Value Ref Range   Sodium 138 135 - 145 mmol/L   Potassium 3.3 (L) 3.5 - 5.1 mmol/L   Chloride 102 98 - 111 mmol/L   CO2 28 22 - 32 mmol/L   Glucose,  Bld 147 (H) 70 - 99 mg/dL   BUN 9 6 - 20 mg/dL   Creatinine, Ser 3.76 0.44 - 1.00 mg/dL   Calcium 8.2 (L) 8.9 - 10.3 mg/dL   Total Protein 7.0 6.5 - 8.1 g/dL   Albumin 3.4 (L) 3.5 - 5.0 g/dL   AST 16 15 - 41 U/L   ALT 46 (H) 0 - 44 U/L   Alkaline Phosphatase 92 38 - 126 U/L   Total Bilirubin 0.7 0.3 - 1.2 mg/dL   GFR, Estimated >28 >31 mL/min   Anion gap 8 5 - 15  Magnesium     Status: Abnormal   Collection Time: 06/24/21  9:59 PM  Result Value Ref Range   Magnesium 5.8 (H) 1.7 - 2.4 mg/dL    Studies/Results:   Scheduled Meds:  acetaminophen  1,000 mg Oral Q8H   furosemide  20 mg Oral BID   metFORMIN  500 mg Oral BID WC   NIFEdipine  60 mg Oral BID   prenatal multivitamin  1 tablet Oral Q1200   Continuous Infusions:  lactated ringers 50 mL/hr at 06/25/21 0506   magnesium sulfate 2 g/hr (06/25/21 0505)   PRN Meds:labetalol **AND** labetalol **AND**  labetalol **AND** hydrALAZINE **AND** Measure blood pressure    LOS: 0 days   Reva Bores, MD 06/25/2021 7:24 AM

## 2021-06-26 DIAGNOSIS — O1495 Unspecified pre-eclampsia, complicating the puerperium: Principal | ICD-10-CM

## 2021-06-26 MED ORDER — FUROSEMIDE 20 MG PO TABS: 20.0000 mg | ORAL_TABLET | Freq: Two times a day (BID) | ORAL | 0 refills | Status: AC

## 2021-06-26 MED ORDER — NIFEDIPINE ER 60 MG PO TB24: 60.0000 mg | ORAL_TABLET | Freq: Two times a day (BID) | ORAL | 0 refills | Status: AC

## 2021-06-26 NOTE — Discharge Summary (Signed)
Physician Discharge Summary  Patient ID: Toni Morgan MRN: [id number] DOB/AGE: Jan 08, 1986 35 y.o.  Admit date: 06/24/2021 Discharge date: 06/26/2021  Admission Diagnoses: Postpartum preeclampsia  Discharge Diagnoses:  Active Problems:   Preeclampsia in postpartum period   Discharged Condition: stable  Hospital Course: [id number] [id number] admitted due to postpartum preeclampsia.  She was treated with IV Magnesium and anti-hypertensives.  Blood pressure improved and headache improved.  She was discharged home on Procardia and Lasix with plans for outpatient follow up.  Consults: None  Significant Diagnostic Studies: labs: No results found for this or any previous visit (from the past 24 hour(s)).   Treatments: IV magnesium, anti-hypertensives  Discharge Exam: Blood pressure (!) 107/57, pulse 98, temperature 97.8 F (36.6 C), temperature source Oral, resp. rate 16, height 5\' 8"  (1.727 m), weight 91.6 kg, SpO2 100 %, not currently breastfeeding. Physical Exam:  Physical Exam:  General: alert, cooperative and no distress Chest: no respiratory distress Heart: regular rate and rhythm Abdomen: soft, nontender, below umbilicus Uterine Fundus: firm, appropriately tender DVT Evaluation: No calf swelling or tenderness Extremities: no edema Skin: warm, dry  Disposition: Discharge disposition: 01-Home or Self Care        Allergies as of 06/26/2021   No Known Allergies      Medication List     TAKE these medications    Acetaminophen Extra Strength 500 MG tablet Generic drug: acetaminophen Take 2 tablets (1,000 mg total) by mouth every 8 (eight) hours.   furosemide 20 MG tablet Commonly known as: LASIX Take 1 tablet (20 mg total) by mouth 2 (two) times daily for 4 days.   metFORMIN 500 MG tablet Commonly known as: GLUCOPHAGE Take 1 tablet (500 mg total) by mouth 2 (two) times daily with a meal.   NIFEdipine 60 MG 24 hr tablet Commonly known as: ADALAT CC Take 1 tablet  (60 mg total) by mouth 2 (two) times daily.   prenatal multivitamin Tabs tablet Take 1 tablet by mouth daily at 12 noon.        Follow-up Information     [facility] Follow up in 1 week(s).   Specialty: Obstetrics and Gynecology Contact information: [address] [place] [phone]                Signed: [phone] 06/26/2021, 6:32 AM

## 2021-06-26 NOTE — Progress Notes (Signed)
POSTPARTUM PREECLAMPSIA- readmit  Subjective:  Toni Morgan is a 35 y.o. [id number] admitted due to postpartum preeclampsia. Today she notes no acute complaints. She denies any problems with ambulating, voiding or po intake. Denies nausea or vomiting. Denies fever/chills/chest pain/SOB.  no HA, no blurry vision, noRUQ pain  Objective: Blood pressure (!) 107/57, pulse 98, temperature 97.8 F (36.6 C), temperature source Oral, resp. rate 16, height 5\' 8"  (1.727 m), weight 91.6 kg, SpO2 100 %, not currently breastfeeding. BP range: 104-137/57-98 UOP: 550cc/8hr  Physical Exam:  General: alert, cooperative and no distress Chest: no respiratory distress Heart: regular rate and rhythm Abdomen: soft, nontender, below umbilicus Uterine Fundus: firm, appropriately tender DVT Evaluation: No calf swelling or tenderness Extremities: no edema Skin: warm, dry  Results for orders placed or performed during the hospital encounter of 06/24/21 (from the past 24 hour(s))  Comprehensive metabolic panel     Status: Abnormal   Collection Time: 06/25/21  7:29 AM  Result Value Ref Range   Sodium 135 135 - 145 mmol/L   Potassium 3.5 3.5 - 5.1 mmol/L   Chloride 101 98 - 111 mmol/L   CO2 25 22 - 32 mmol/L   Glucose, Bld 107 (H) 70 - 99 mg/dL   BUN 10 6 - 20 mg/dL   Creatinine, Ser 06/27/21 0.44 - 1.00 mg/dL   Calcium 7.4 (L) 8.9 - 10.3 mg/dL   Total Protein 6.9 6.5 - 8.1 g/dL   Albumin 3.3 (L) 3.5 - 5.0 g/dL   AST 21 15 - 41 U/L   ALT 40 0 - 44 U/L   Alkaline Phosphatase 88 38 - 126 U/L   Total Bilirubin 0.6 0.3 - 1.2 mg/dL   GFR, Estimated 6.71 >24 mL/min   Anion gap 9 5 - 15    Assessment/Plan: Toni Morgan is a 35 y.o. [phone] with postpartum preeclampsia- VBAC at 33wks- 10/10  -s/p Mag x 24hr -Current medication Lasix20mg  bid, Procardia XL 60mg  daily -lab work stable with improving Cr -plan for discharge home today   LOS: 1 day   [id number], DO Faculty Attending, Center for 06/26/2021, 5:04 AM

## 2021-06-27 ENCOUNTER — Telehealth (HOSPITAL_COMMUNITY): Payer: Self-pay | Admitting: *Deleted

## 2021-06-27 NOTE — Telephone Encounter (Signed)
Attempted Hospital Discharge Follow-Up Call.  Left voice mail requesting that patient return RN's phone call.  

## 2021-06-29 ENCOUNTER — Encounter: Payer: Managed Care, Other (non HMO) | Admitting: Family Medicine

## 2021-06-29 ENCOUNTER — Other Ambulatory Visit: Payer: Managed Care, Other (non HMO)

## 2021-07-02 ENCOUNTER — Ambulatory Visit: Payer: Self-pay

## 2021-07-02 NOTE — Lactation Note (Addendum)
This note was copied from a baby's chart. Lactation Consultation Note  Patient Name: Toni Morgan [id number] Date: 07/02/2021   Age:34 wk.o.  LC to visit with mom in the room, but NICU RN Tami voiced that this baby is being 100% formula fed. F/U with mom over the phone and she confirmed she's no longer pumping, she voiced that as today her breasts have gotten back to normal, she denied any pain/discomfort so far. NICU LC services no longer needed. No charge.  Feeding Nipple Type: Dr. Levert Feinstein Preemie  Consult Status Consult Status: Complete Date: 07/02/21   Toni Morgan 07/02/2021, 5:19 PM

## 2021-07-04 ENCOUNTER — Other Ambulatory Visit: Payer: Self-pay

## 2021-07-04 ENCOUNTER — Telehealth: Payer: Self-pay

## 2021-07-04 ENCOUNTER — Ambulatory Visit (INDEPENDENT_AMBULATORY_CARE_PROVIDER_SITE_OTHER): Payer: Managed Care, Other (non HMO) | Admitting: *Deleted

## 2021-07-04 VITALS — BP 132/93 | HR 90 | Ht 67.0 in | Wt 201.1 lb

## 2021-07-04 DIAGNOSIS — Z0130 Encounter for examination of blood pressure without abnormal findings: Secondary | ICD-10-CM

## 2021-07-04 DIAGNOSIS — Z8759 Personal history of other complications of pregnancy, childbirth and the puerperium: Secondary | ICD-10-CM

## 2021-07-04 NOTE — Progress Notes (Signed)
Here for nurse visit for BP check. Had postpartum preeclampsia.  Did not take procardia this am because she was not sure if she should. Denies headaches, or any issues.  States she is not taking metformin  and will not until they determine she has to.Had started pp 2 hour when came for bp check but was sent to hospital so it was not collected. I explained per chart it appears they think she has Type 2 DM. She will discuss with provider at her pp check next week.  Yannely Kintzel,RN

## 2021-07-04 NOTE — Progress Notes (Signed)
[name] today and history reviewed with Dr. Alvester Morin. Orders to continue Procardia as ordered, keep pp visit. Patient voices understanding. Also reviewed if she has preeclampsia symptoms again to go to hospital.  Toni Morgan

## 2021-07-04 NOTE — Telephone Encounter (Signed)
See included message thread. Called pt to give recommendation of repeating PP 2 HR GTT. Pt agreeable. Will return for lab appt same day as PP visit. Pt instructed to come fasting after midnight.

## 2021-07-04 NOTE — Telephone Encounter (Addendum)
-----   Message from Milas Hock, MD sent at 07/04/2021  1:08 PM EDT ----- Regarding: RE: GTT Can you let her know this and let her know in that case I would advise repeating it once she is further out from delivery as well?  Thanks! Pad  ----- Message ----- From: Marjo Bicker, RN Sent: 06/27/2021   8:18 AM EDT To: Milas Hock, MD Subject: RE: GTT                                        Fasting was drawn at 0830. This result was 74. I had to put in a new order because [facility] couldn't use the 2 hr order if she didn't complete it.  She finished the glucose drink at 0832, so her 2 hour should have been drawn at 1032. Looks like it was drawn 30 minutes too early.   Fleet Contras ----- Message ----- From: Milas Hock, MD Sent: 06/24/2021   3:24 PM EDT To: Marjo Bicker, RN Subject: GTT                                            Hey!   I know she started her 2 hr there - do you have those results and timing of the blood draw done there?  She had a glucose here at the hospital that was 147.   I was hoping to let her know next week if she will need to redo the 2 hr since the timing got potentially messed up.   Thanks, pad

## 2021-07-06 ENCOUNTER — Other Ambulatory Visit: Payer: Managed Care, Other (non HMO)

## 2021-07-06 ENCOUNTER — Encounter: Payer: Managed Care, Other (non HMO) | Admitting: Obstetrics and Gynecology

## 2021-07-06 NOTE — Progress Notes (Signed)
Attestation of Attending Supervision of clinical support staff: I agree with the care provided to this patient and was available for any consultation.  I have reviewed the RN's note and chart. I was consulted and my recommendations are documented in RN note.   Lyndel Safe MD MPH Attending Physician Faculty Practice- Center for [facility]

## 2021-07-11 ENCOUNTER — Other Ambulatory Visit: Payer: Self-pay | Admitting: *Deleted

## 2021-07-11 DIAGNOSIS — O24319 Unspecified pre-existing diabetes mellitus in pregnancy, unspecified trimester: Secondary | ICD-10-CM

## 2021-07-12 ENCOUNTER — Telehealth: Payer: Self-pay

## 2021-07-12 ENCOUNTER — Ambulatory Visit (INDEPENDENT_AMBULATORY_CARE_PROVIDER_SITE_OTHER): Payer: Managed Care, Other (non HMO) | Admitting: Nurse Practitioner

## 2021-07-12 ENCOUNTER — Other Ambulatory Visit: Payer: Managed Care, Other (non HMO)

## 2021-07-12 ENCOUNTER — Other Ambulatory Visit: Payer: Self-pay

## 2021-07-12 DIAGNOSIS — Z91199 Patient's noncompliance with other medical treatment and regimen due to unspecified reason: Secondary | ICD-10-CM

## 2021-07-12 NOTE — Progress Notes (Deleted)
    Post Partum Visit Note  Toni Morgan is a 35 y.o. [phone] female who presents for a postpartum visit. She is 4 weeks and 1 day postpartum following a normal spontaneous vaginal delivery.  I have fully reviewed the prenatal and intrapartum course. The delivery was at 33.5 gestational weeks.  Anesthesia: none. Postpartum course has been ***. Baby is doing well***. Baby is feeding by {breast/bottle:69}. Bleeding {vag bleed:12292}. Bowel function is {normal:32111}. Bladder function is {normal:32111}. Patient {is/is not:9024} sexually active. Contraception method is {contraceptive method:5051}. Postpartum depression screening: {gen negative/positive:315881}.   The pregnancy intention screening data noted above was reviewed. Potential methods of contraception were discussed. The patient elected to proceed with No data recorded.    Health Maintenance Due  Topic Date Due   HEMOGLOBIN A1C  Never done   Pneumococcal Vaccine 74-91 Years old (1 - PCV) Never done   FOOT EXAM  Never done   OPHTHALMOLOGY EXAM  Never done   URINE MICROALBUMIN  Never done   Hepatitis C Screening  Never done   PAP SMEAR-Modifier  10/11/2013   COVID-19 Vaccine (3 - Booster for Pfizer series) 10/07/2020    {Common ambulatory SmartLinks:19316}  Review of Systems {ros; complete:30496}  Objective:  There were no vitals taken for this visit.   General:  {gen appearance:16600}   Breasts:  {desc; normal/abnormal/not indicated:14647}  Lungs: {lung exam:16931}  Heart:  {heart exam:5510}  Abdomen: {abdomen exam:16834}   Wound {Wound assessment:11097}  GU exam:  {desc; normal/abnormal/not indicated:14647}       Assessment:    There are no diagnoses linked to this encounter.  *** postpartum exam.   Plan:   Essential components of care per ACOG recommendations:  1.  Mood and well being: Patient with {gen negative/positive:315881} depression screening today. Reviewed local resources for support.  - Patient  tobacco use? {tobacco use:25506}  - hx of drug use? {yes/no:25505}    2. Infant care and feeding:  -Patient currently breastmilk feeding? {yes/no:25502}  -Social determinants of health (SDOH) reviewed in EPIC. No concerns***The following needs were identified***  3. Sexuality, contraception and birth spacing - Patient {DOES_DOES QQI:29798} want a pregnancy in the next year.  Desired family size is {NUMBER 1-10:22536} children.  - Reviewed forms of contraception in tiered fashion. Patient desired {PLAN CONTRACEPTION:313102} today.   - Discussed birth spacing of 18 months  4. Sleep and fatigue -Encouraged family/partner/community support of 4 hrs of uninterrupted sleep to help with mood and fatigue  5. Physical Recovery  - Discussed patients delivery and complications. She describes her labor as {description:25511} - Patient had a {CHL AMB DELIVERY:[phone]}. Patient had a {laceration:25518} laceration. Perineal healing reviewed. Patient expressed understanding - Patient has urinary incontinence? {yes/no:25515} - Patient {ACTION; IS/IS XQJ:[id number]} safe to resume physical and sexual activity  6.  Health Maintenance - HM due items addressed {Yes or If no, why not?:20788} - Last pap smear No results found for: DIAGPAP Pap smear {done:10129} at today's visit.  -Breast Cancer screening indicated? {indicated:25516}  7. Chronic Disease/Pregnancy Condition follow up: {Follow up:25499}  - PCP follow up  Cline Crock, RN Center for [employer], [facility]

## 2021-07-12 NOTE — Telephone Encounter (Signed)
Patient left the office today without being seen due to wait time. Rescheduled postpartum visit to 08/01/21. I spoke with the patient on the phone and she stated that she is not having any concerns today. She denies having any pre-eclampsia symptoms. However, per patient she does get a headache after she takes her Nifedipine. She states the headache starts about 1-2 hours after she takes the medication. The last headache she had was last night. Patient denies any other symptoms. Per patient she is still taking her Nifedipine twice daily per order. Patient checked her blood pressure this morning and found it to be 122/87. Reviewed phone call with Nolene Bernheim, NP. Per NP patient is to follow up at rescheduled postpartum visit as scheduled.   Alesia Richards, RN

## 2021-07-13 NOTE — Progress Notes (Signed)
Patient was not able to stay for her visit today.  Rescheduled.  Nolene Bernheim, RN, MSN, NP-BC Nurse Practitioner, [facility] for [employer], [facility] 07/13/2021 11:11 PM

## 2021-08-01 ENCOUNTER — Ambulatory Visit: Payer: Managed Care, Other (non HMO) | Admitting: Obstetrics & Gynecology

## 2021-08-17 ENCOUNTER — Ambulatory Visit: Payer: Managed Care, Other (non HMO) | Admitting: Obstetrics and Gynecology

## 2022-02-17 IMAGING — US US OB TRANSVAGINAL
1 series · 15 of 28 positions shown · non-contrast
Comparison: December 01, 2020

CLINICAL DATA: Vaginal bleeding.

EXAM:
OBSTETRIC <14 WK US AND TRANSVAGINAL OB US
TECHNIQUE: Both transabdominal and transvaginal ultrasound examinations were
performed for complete evaluation of the gestation as well as the
maternal uterus, adnexal regions, and pelvic cul-de-sac.
Transvaginal technique was performed to assess early pregnancy.

[Series 1: us ob transvaginal · 15 of 28 slices shown]
[im 1/28]
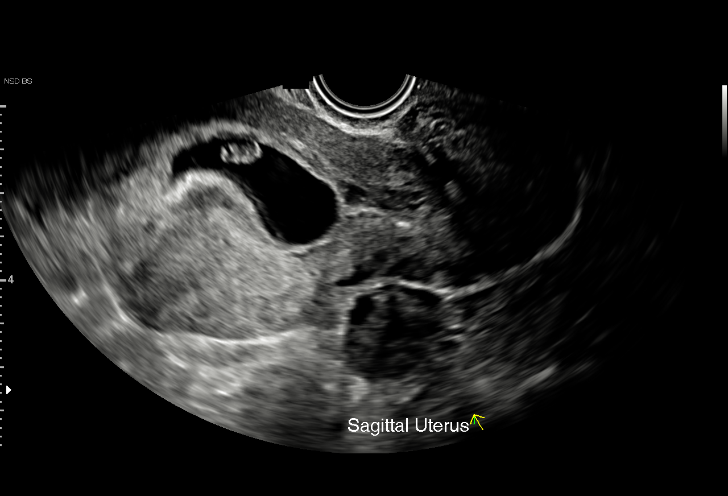
[im 3/28]
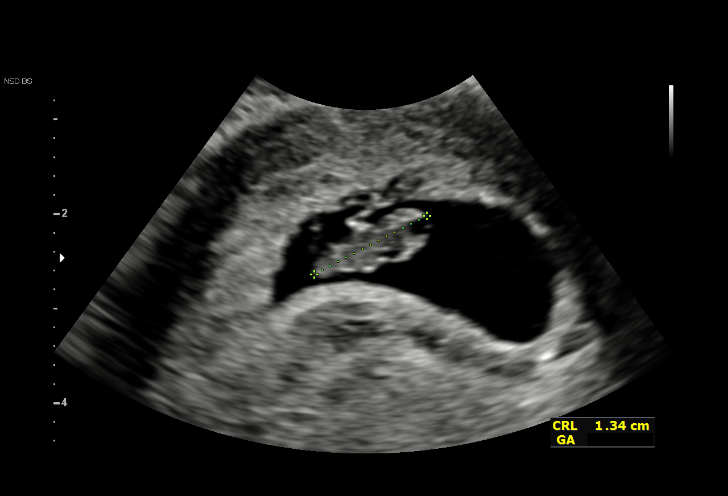
[im 5/28]
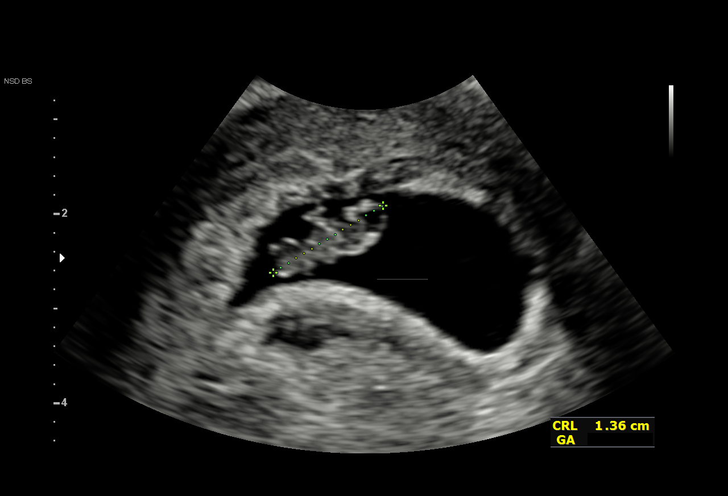
[im 7/28]
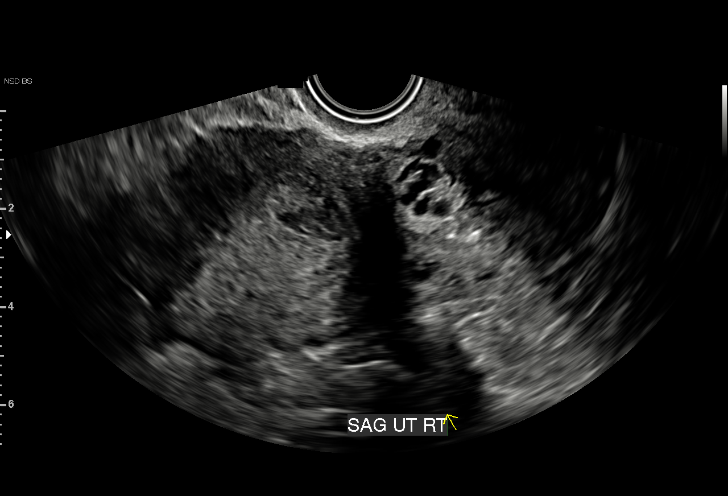
[im 9/28]
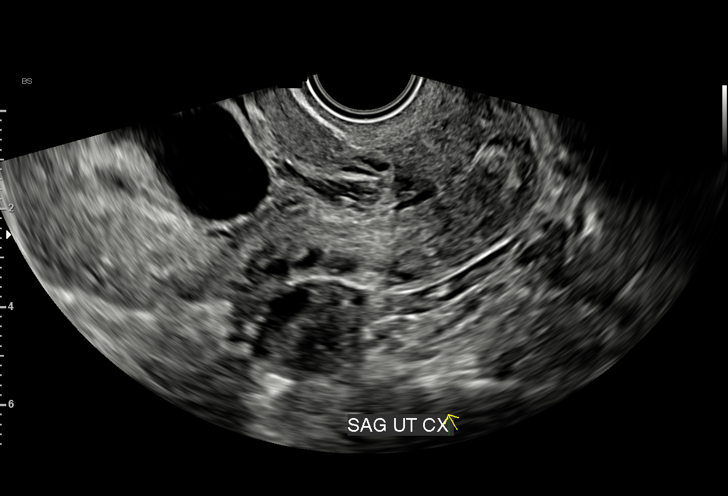
[im 11/28]
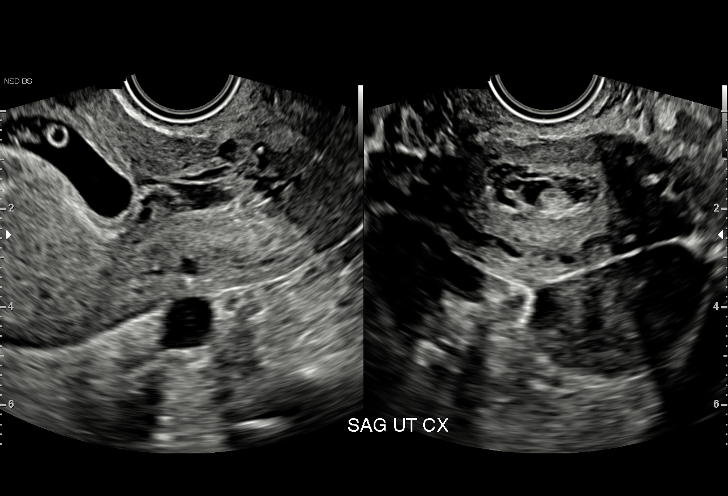
[im 13/28]
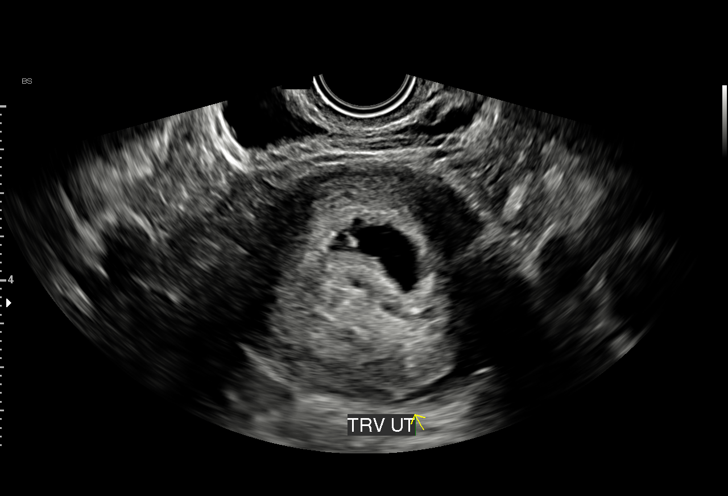
[im 15/28]
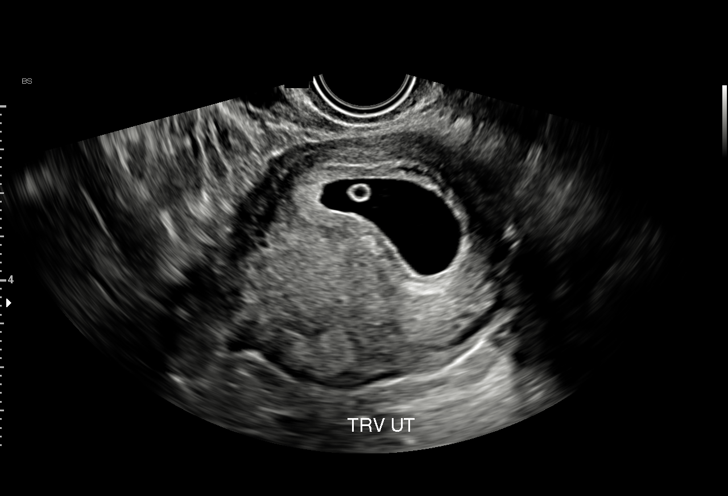
[im 16/28]
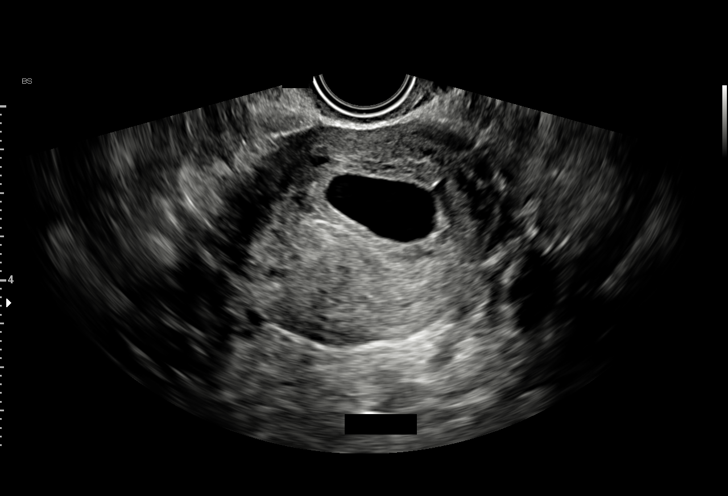
[im 18/28]
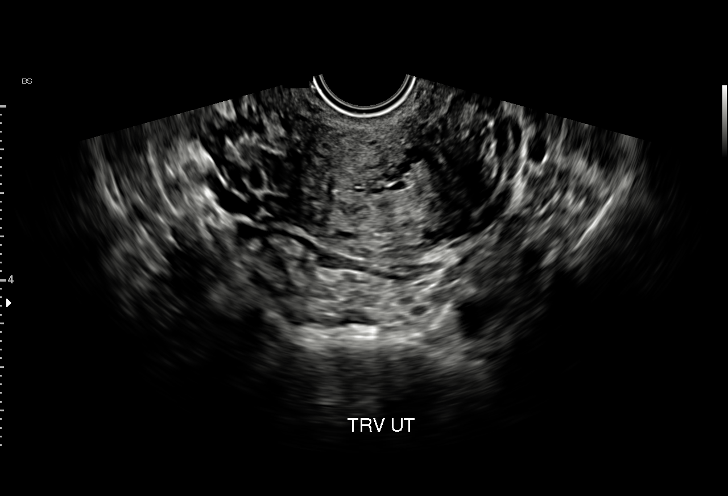
[im 20/28]
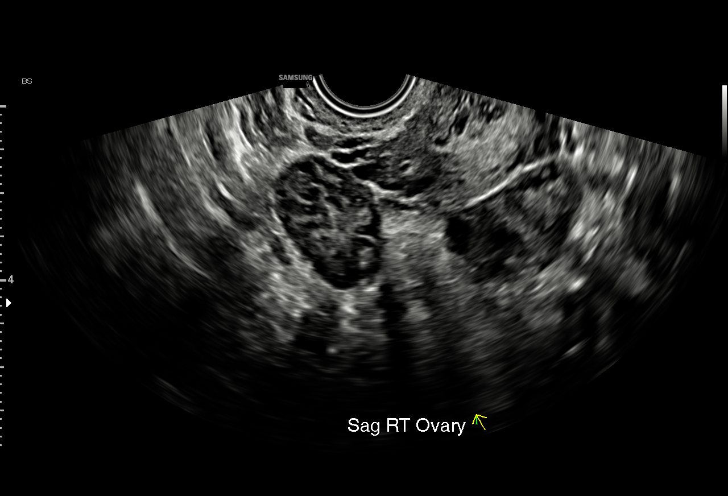
[im 22/28]
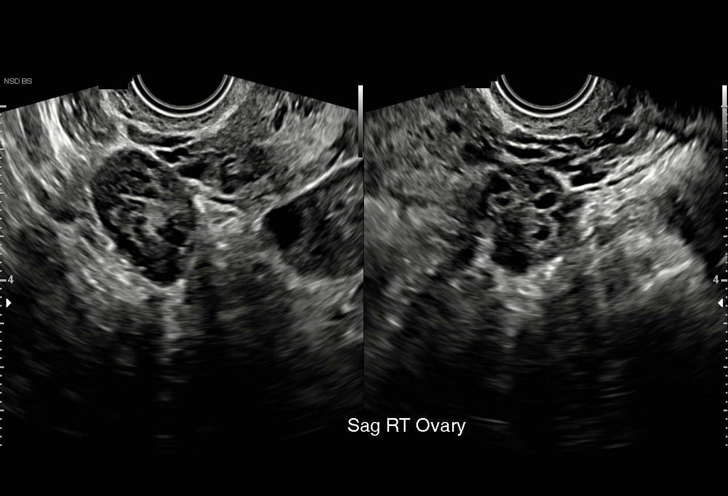
[im 24/28]
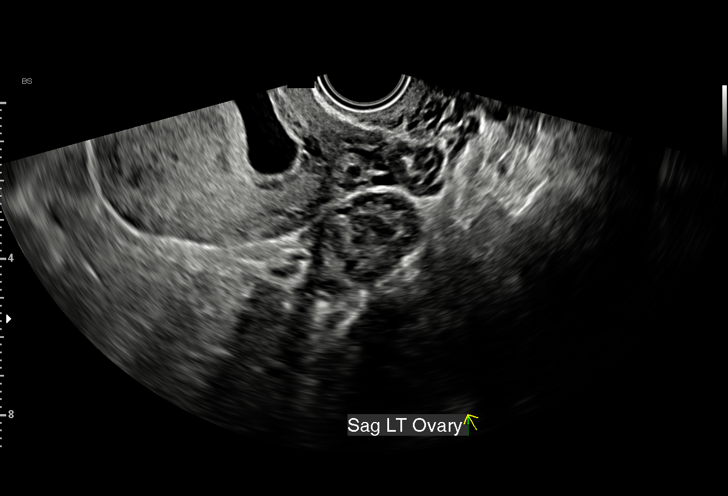
[im 26/28]
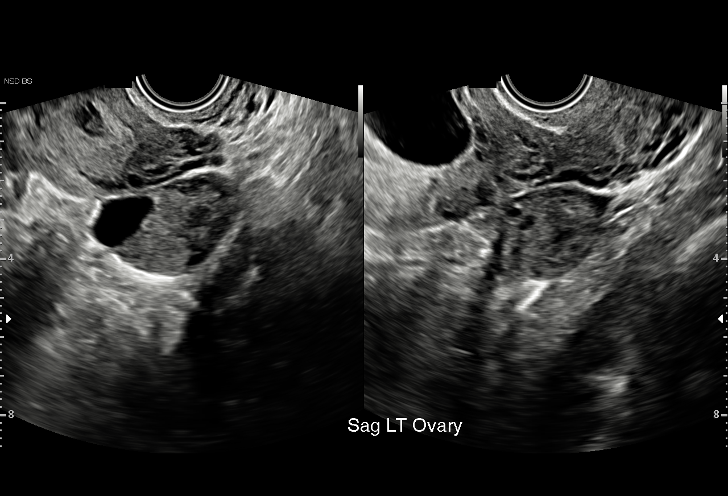
[im 28/28]
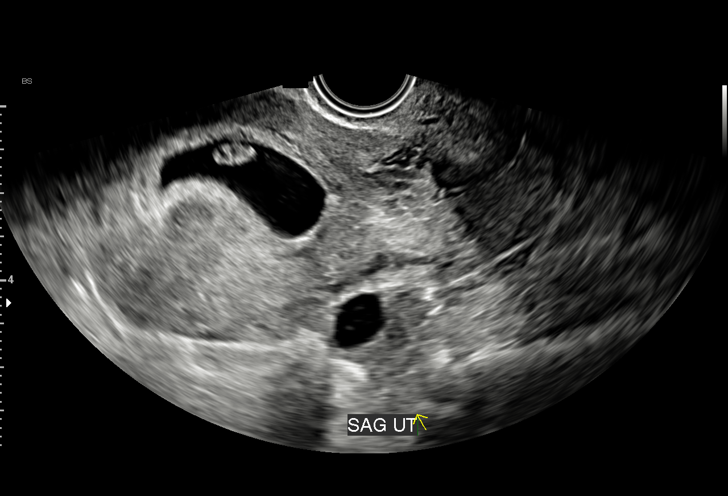

[15 of 28 positions shown; findings below may reference images not displayed]

FINDINGS: Intrauterine gestational sac: Single

Yolk sac:  Visualized.

Embryo:  Visualized.

Cardiac Activity: Visualized.

Heart Rate: 158 bpm

CRL:  13.5 mm   7 w   4 d                  US EDC: July 27, 2021

Subchorionic hemorrhage:  Small

Maternal uterus/adnexae: The bilateral ovaries are visualized and
are normal in appearance.

No pelvic free fluid is seen.
IMPRESSION: 1. Single, viable intrauterine pregnancy at approximately 7 weeks
and 4 days gestation by ultrasound evaluation.
2. Small subchorionic hemorrhage.

## 2022-06-07 IMAGING — US US MFM OB FOLLOW-UP
2 series · 13 of 28 positions shown · non-contrast
Comparison: none

[Series 1: us mfm ob follow-up · 12 of 97 slices shown (1 of 2)]
[im 5/97]
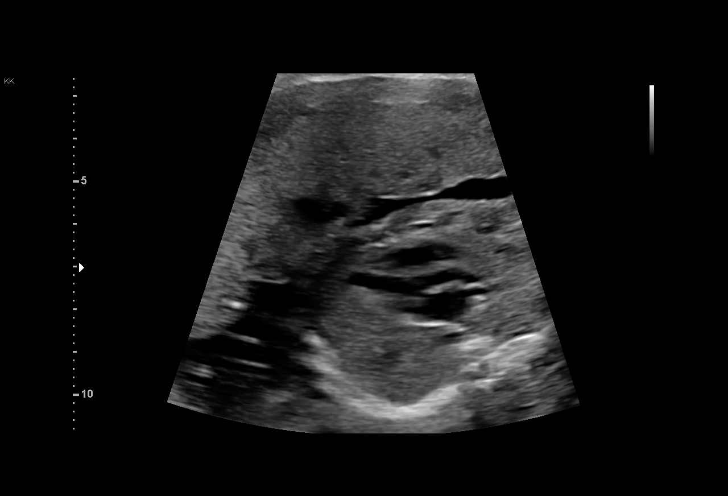
[im 13/97]
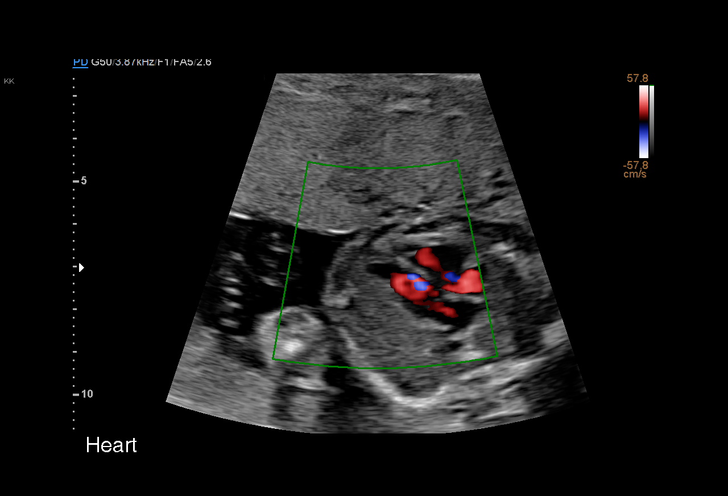
[im 21/97]
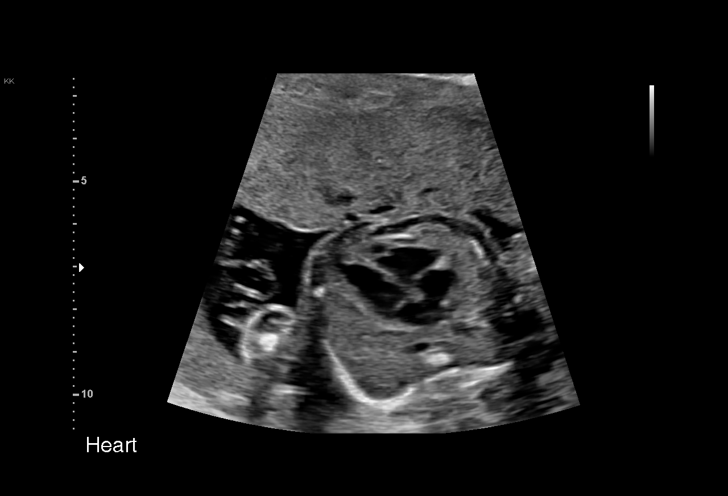
[im 29/97]
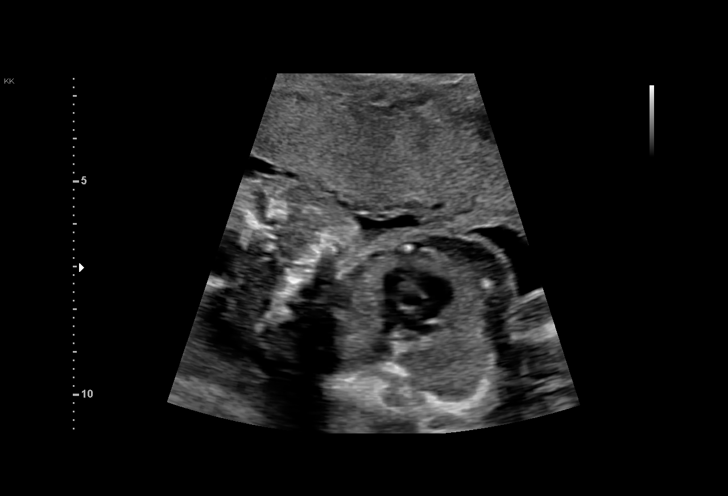
[im 37/97]
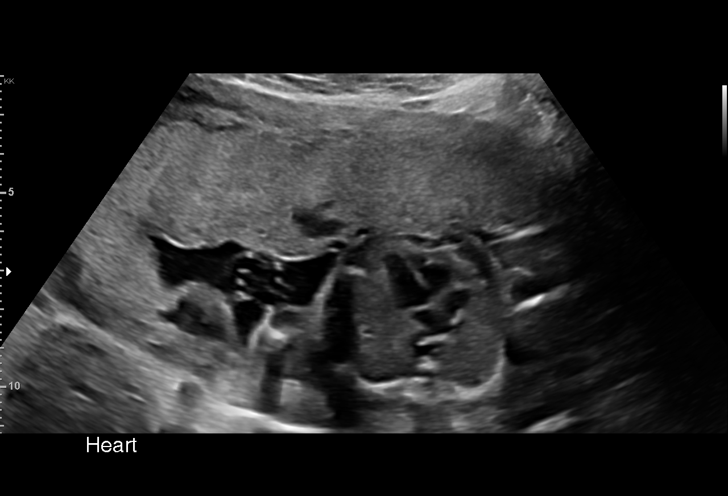
[im 45/97]
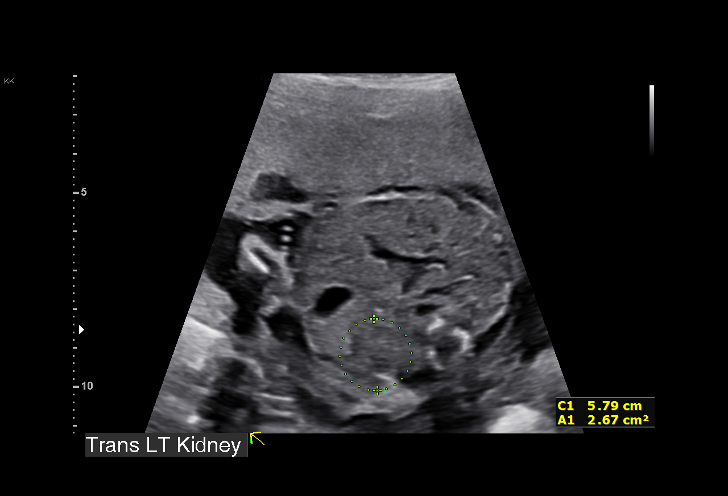
[im 57/97]
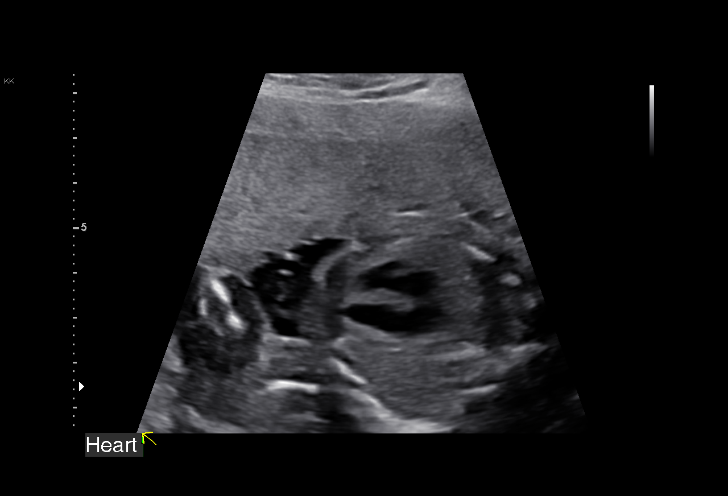
[im 65/97]
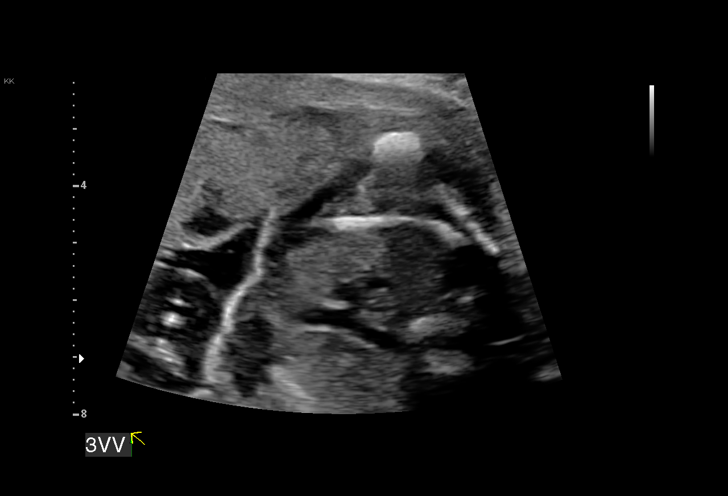
[im 73/97]
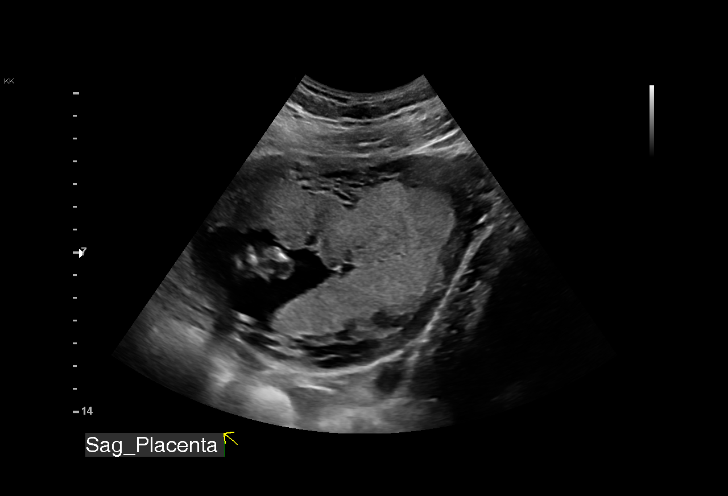
[im 81/97]
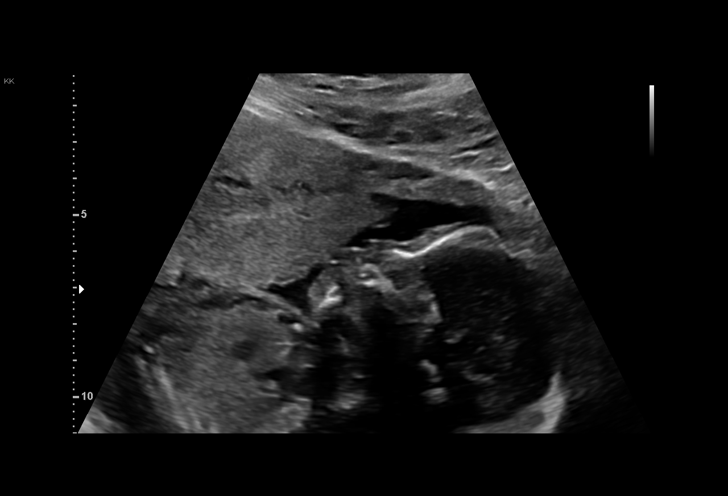
[im 89/97]
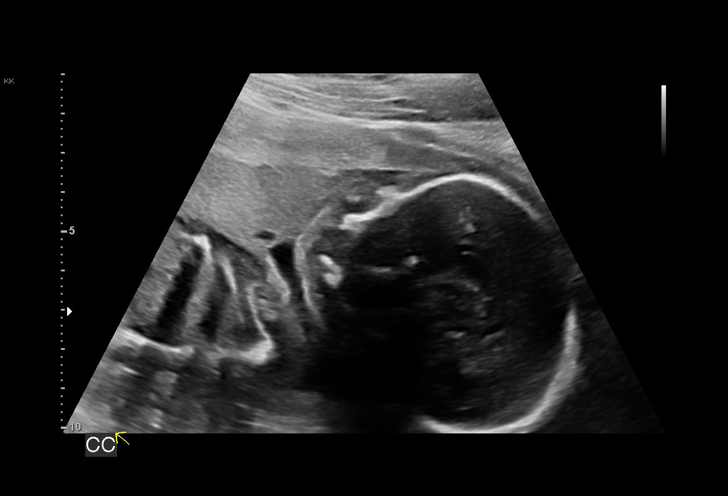
[im 97/97]
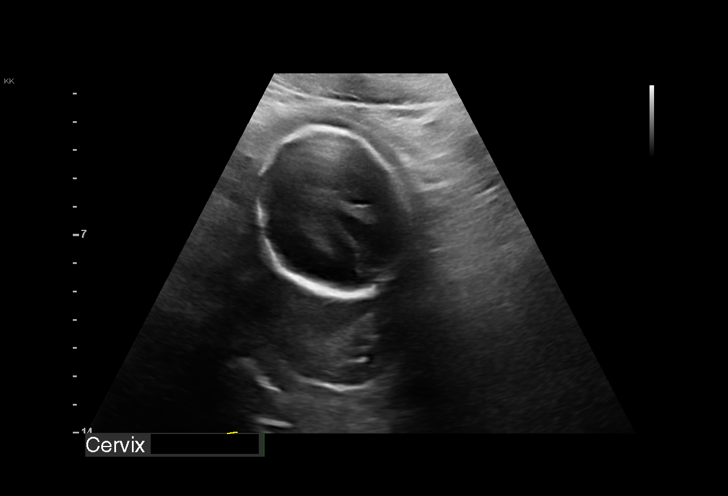

[Series 1: us mfm ob follow-up · 1 of 12 slices shown (2 of 2)]
[im 6/12]
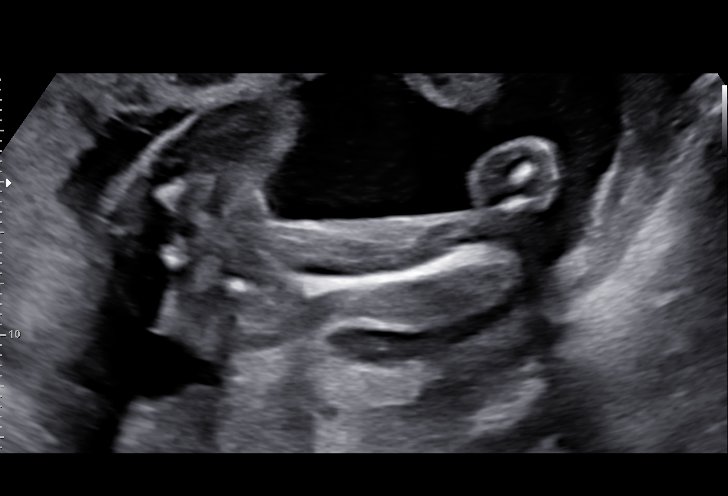

[13 of 28 positions shown; findings below may reference images not displayed]

[facility]                                  [HOSPITAL] at
                   OBGYN
                   [REDACTED] #[id number]

Indications

 Cervical incompetence, second trimester
 Pre-existing diabetes, type 2, in pregnancy,
 second trimester(Humulin,Metfromin)
 23 weeks gestation of pregnancy
 Obesity complicating pregnancy, second
 trimester(BMI 33)
 Previous cesarean delivery, antepartum
 Cervical cerclage suture present, second
 trimester ( Cerclage placed 03/05/21)
 Antenatal follow-up for nonvisualized fetal
 anatomy
 Low Risk NIPS(Negative AFP)
Vital Signs

                                                Height:        5'8"
Fetal Evaluation

 Num Of Fetuses:         1
 Fetal Heart Rate(bpm):  152
 Cardiac Activity:       Observed
 Presentation:           Cephalic
 Placenta:               Anterior
 P. Cord Insertion:      Previously Visualized
 Amniotic Fluid
 AFI FV:      Within normal limits

                             Largest Pocket(cm)

Biometry

 BPD:      54.9  mm     G. Age:  22w 5d         25  %    CI:        78.57   %    70 - 86
                                                         FL/HC:      22.0   %    19.2 -
 HC:      195.9  mm     G. Age:  21w 6d        2.1  %    HC/AC:      1.06        1.05 -
 AC:      185.4  mm     G. Age:  23w 2d         43  %    FL/BPD:     78.5   %    71 - 87
 FL:       43.1  mm     G. Age:  24w 1d         66  %    FL/AC:      23.2   %    20 - 24

 Est. FW:     595  gm      1 lb 5 oz     50  %
OB History

 Gravidity:    4         Term:   1        Prem:   0        SAB:   1
 TOP:          1       Ectopic:  0        Living: 1
Gestational Age

 LMP:           23w 2d        Date:  10/20/20                 EDD:   07/27/21
 U/S Today:     23w 0d                                        EDD:   07/29/21
 Best:          23w 2d     Det. By:  LMP  (10/20/20)          EDD:   07/27/21
Anatomy

 Cranium:               Appears normal         Aortic Arch:            Appears normal
 Cavum:                 Appears normal         Ductal Arch:            Appears normal
 Ventricles:            Previously seen        Diaphragm:              Appears normal
 Choroid Plexus:        Previously seen        Stomach:                Appears normal, left
                                                                       sided
 Cerebellum:            Previously seen        Abdomen:                Appears normal
 Posterior Fossa:       Previously seen        Abdominal Wall:         Previously seen
 Nuchal Fold:           Previously seen        Cord Vessels:           Previously seen
 Face:                  Appears normal         Kidneys:                Appear normal
                        (orbits and profile)
 Lips:                  Appears normal         Bladder:                Appears normal
 Thoracic:              Appears normal         Spine:                  Previously seen
 Heart:                 Appears normal         Upper Extremities:      Previously seen
                        (4CH, axis, and
                        situs)
 RVOT:                  Appears normal         Lower Extremities:      Previously seen
 LVOT:                  Appears normal

 Other:  Fetus appears to be female. Technically difficult due to maternal
         habitus and fetal position.
Cervix Uterus Adnexa

 Cervix
 Dilated Cerclage visualized.
Comments

 This patient was seen for a follow up exam due to cervical
 incompetence and pregestational diabetes that is treated with
 insulin and metformin.  The patient had a cervical cerclage
 placed earlier in her pregnancy.  She denies any problems
 since her last exam and denies any lower abdominal
 cramping or contractions.
 She was informed that the fetal growth and amniotic fluid
 level appears appropriate for her gestational age.
 The views of the fetal heart were visualized today.  There
 were no obvious cardiac defects noted.  The limitations of
 ultrasound in the detection of all anomalies was discussed.
 It appears that her cervix is funneled all the way down to the
 cerclage stitch.  The increased risk of a preterm birth due to
 this finding was discussed.  She was advised to continue to
 monitor for signs and symptoms of preterm labor and was
 advised to continue using the daily vaginal progesterone.
 Due to pregestational diabetes, she was referred to [HOSPITAL]
 pediatric cardiology for a fetal echocardiogram.
 A follow-up growth scan was scheduled in our office in 3
 weeks.
Recommendations

 Continue daily vaginal progesterone
 Repeat ultrasound for fetal growth 3 weeks
 Fetal echocardiogram scheduled

## 2022-06-11 ENCOUNTER — Other Ambulatory Visit: Payer: Self-pay

## 2022-06-11 ENCOUNTER — Emergency Department (HOSPITAL_BASED_OUTPATIENT_CLINIC_OR_DEPARTMENT_OTHER)
Admission: EM | Admit: 2022-06-11 | Discharge: 2022-06-11 | Disposition: A | Discharge status: Home / Self Care | Payer: Medicaid Other | Attending: Internal Medicine | Admitting: Internal Medicine

## 2022-06-11 ENCOUNTER — Encounter (HOSPITAL_BASED_OUTPATIENT_CLINIC_OR_DEPARTMENT_OTHER): Payer: Self-pay | Admitting: Emergency Medicine

## 2022-06-11 DIAGNOSIS — Y92410 Unspecified street and highway as the place of occurrence of the external cause: Secondary | ICD-10-CM | POA: Insufficient documentation

## 2022-06-11 DIAGNOSIS — M79674 Pain in right toe(s): Secondary | ICD-10-CM | POA: Insufficient documentation

## 2022-06-11 DIAGNOSIS — R0789 Other chest pain: Secondary | ICD-10-CM | POA: Insufficient documentation

## 2022-06-11 DIAGNOSIS — M7918 Myalgia, other site: Secondary | ICD-10-CM

## 2022-06-11 MED ORDER — METHOCARBAMOL 500 MG PO TABS: 1000.0000 mg | ORAL_TABLET | Freq: Four times a day (QID) | ORAL | 0 refills | Status: AC
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
## 2022-07-01 IMAGING — US US MFM OB FOLLOW-UP
1 series · 13 of 28 positions shown · non-contrast
Comparison: none

[Series 1: us mfm ob follow-up · 13 of 73 slices shown]
[im 3/73]
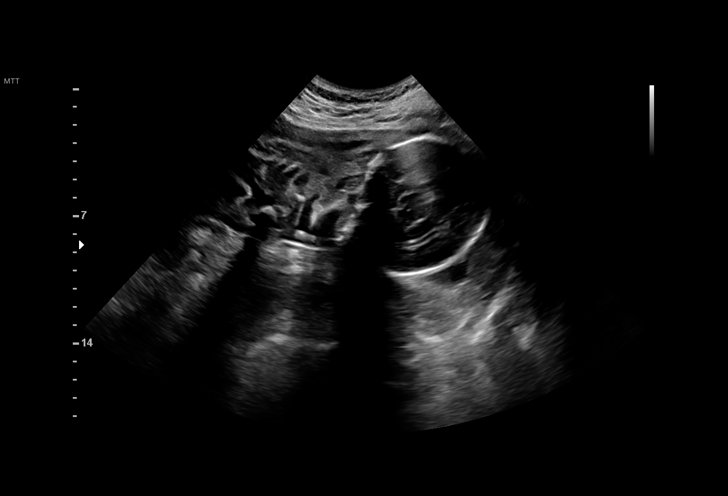
[im 9/73]
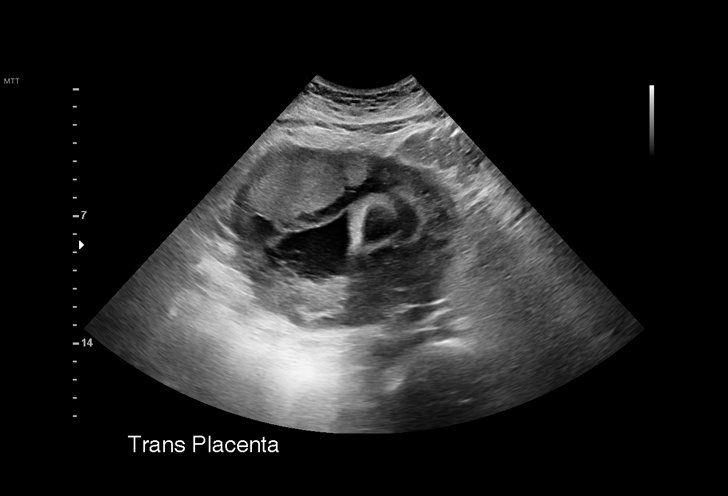
[im 14/73]
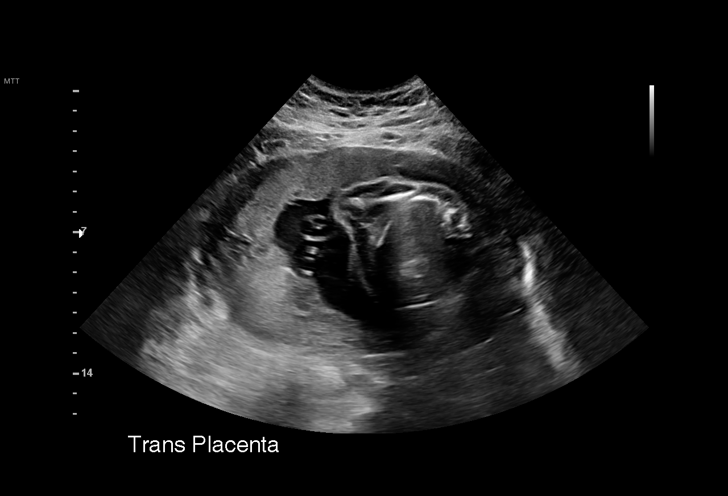
[im 19/73]
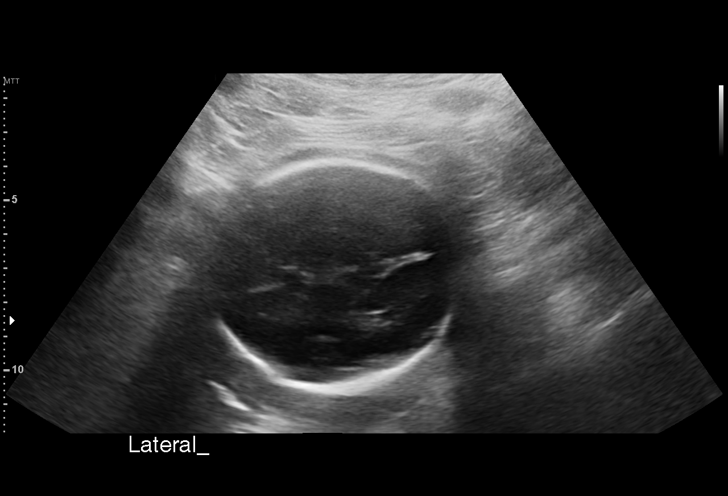
[im 25/73]
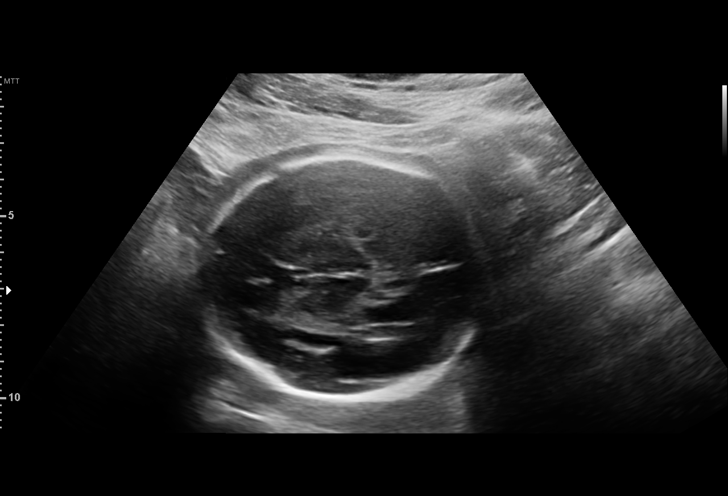
[im 30/73]
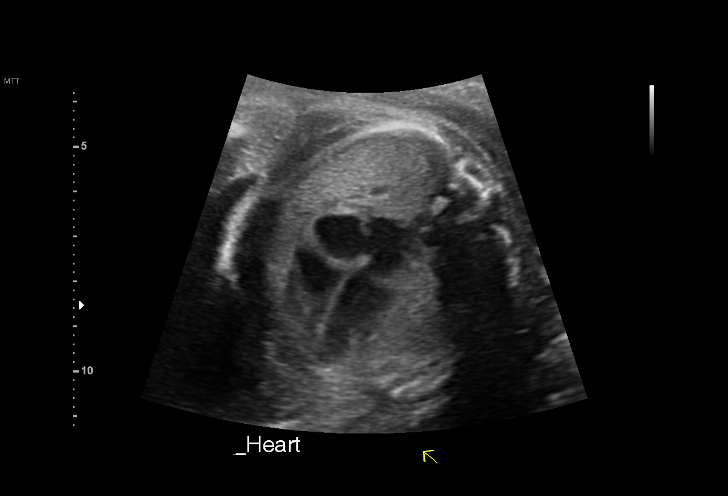
[im 38/73]
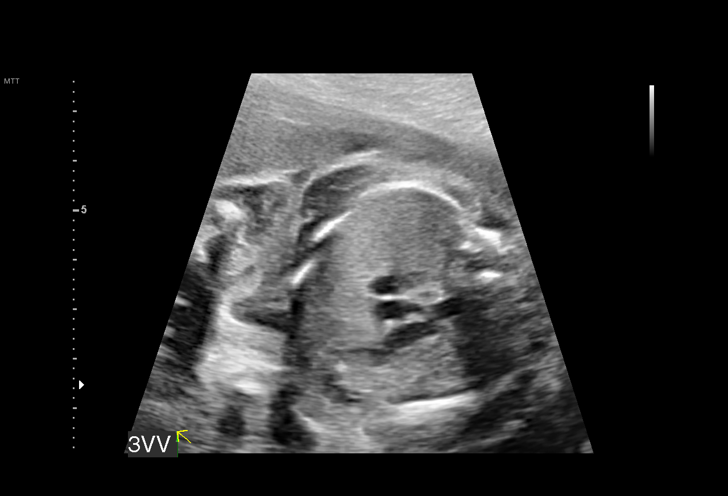
[im 43/73]
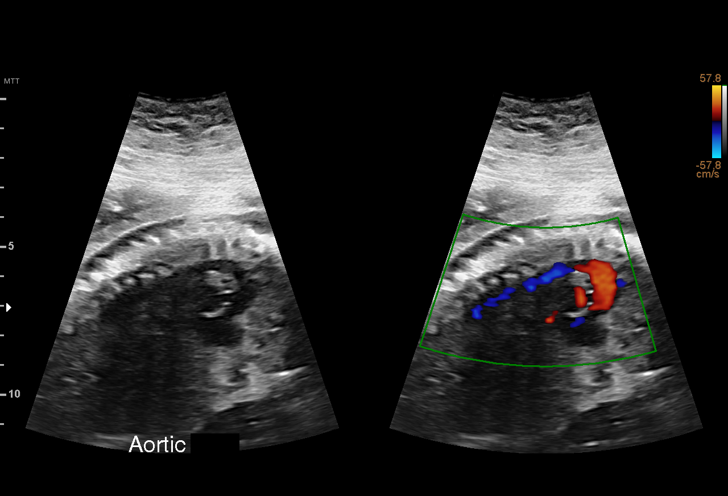
[im 49/73]
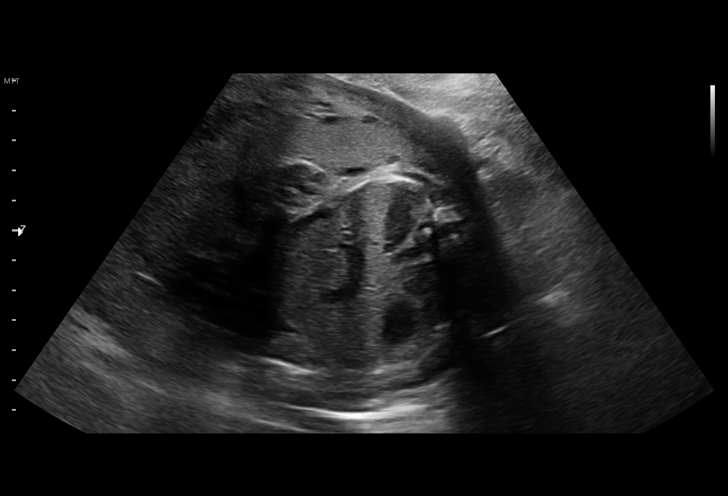
[im 54/73]
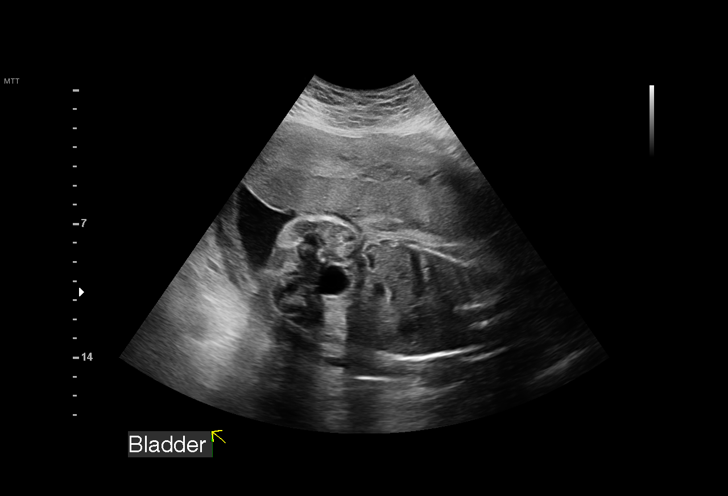
[im 59/73]
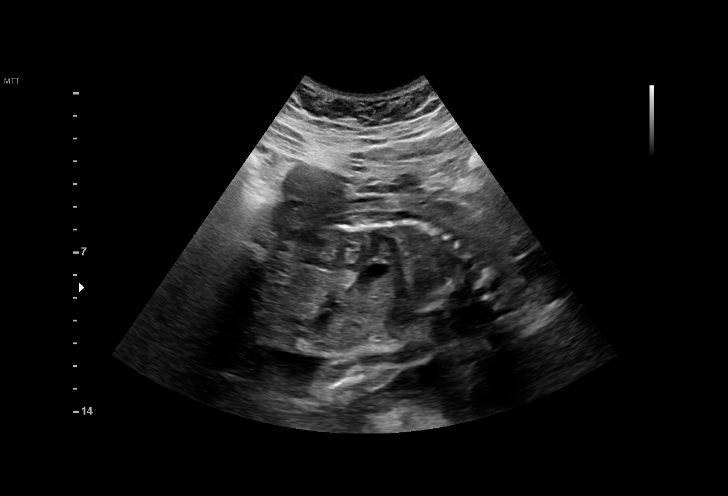
[im 65/73]
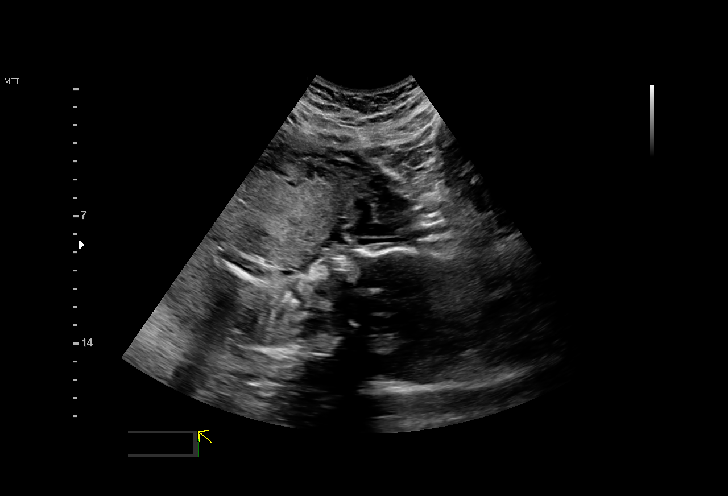
[im 70/73]
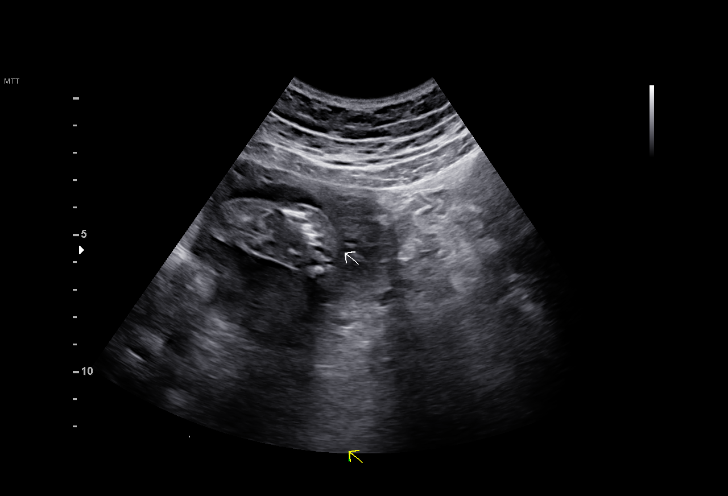

[13 of 28 positions shown; findings below may reference images not displayed]

[facility]                                  [HOSPITAL] at
                   OBGYN
                   [REDACTED] #[id number]

Indications

 Pre-existing diabetes, type 2, in pregnancy,
 second trimester(Humulin,Metfromin)
 Cervical incompetence, second trimester
 Obesity complicating pregnancy, second
 trimester(BMI 33)
 26 weeks gestation of pregnancy
 Previous cesarean delivery, antepartum
 Cervical cerclage suture present, second
 trimester (Cerclage placed 03/05/21)
 Low Risk NIPS(Negative AFP)
 Encounter for other antenatal screening
 follow-up
Vital Signs

                                                Height:        5'8"
Fetal Evaluation

 Num Of Fetuses:         1
 Fetal Heart Rate(bpm):  137
 Cardiac Activity:       Observed
 Presentation:           Cephalic
 Placenta:               Anterior
 P. Cord Insertion:      Previously Visualized
 Amniotic Fluid
 AFI FV:      Subjectively low-normal

                             Largest Pocket(cm)

Biometry

 BPD:      64.7  mm     G. Age:  26w 1d         21  %    CI:        78.49   %    70 - 86
                                                         FL/HC:      21.1   %    18.6 -
 HC:       231   mm     G. Age:  25w 1d        1.3  %    HC/AC:      1.04        1.05 -
 AC:      221.5  mm     G. Age:  26w 4d         37  %    FL/BPD:     75.3   %    71 - 87
 FL:       48.7  mm     G. Age:  26w 3d         25  %    FL/AC:      22.0   %    20 - 24
 CER:      31.5  mm     G. Age:  27w 1d         76  %

 LV:        4.3  mm
 CM:        6.5  mm

 Est. FW:     923  gm      2 lb 1 oz     24  %
OB History

 Gravidity:    4         Term:   1        Prem:   0        SAB:   1
 TOP:          1       Ectopic:  0        Living: 1
Gestational Age

 LMP:           26w 5d        Date:  10/20/20                 EDD:   07/27/21
 U/S Today:     26w 1d                                        EDD:   07/31/21
 Best:          26w 5d     Det. By:  LMP  (10/20/20)          EDD:   07/27/21
Anatomy

 Cranium:               Appears normal         LVOT:                   Appears normal
 Cavum:                 Appears normal         Aortic Arch:            Appears normal
 Ventricles:            Appears normal         Ductal Arch:            Previously seen
 Choroid Plexus:        Previously seen        Diaphragm:              Appears normal
 Cerebellum:            Appears normal         Stomach:                Appears normal, left
                                                                       sided
 Posterior Fossa:       Appears normal         Abdomen:                Previously seen
 Nuchal Fold:           Previously seen        Abdominal Wall:         Previously seen
 Face:                  Orbits and profile     Cord Vessels:           Appears normal (3
                        previously seen                                vessel cord)
 Lips:                  Previously seen        Kidneys:                Appear normal
 Palate:                Not well visualized    Bladder:                Appears normal
 Thoracic:              Appears normal         Spine:                  Previously seen
 Heart:                 Appears normal         Upper Extremities:      Previously seen
                        (4CH, axis, and
                        situs)
 RVOT:                  Previously seen        Lower Extremities:      Previously seen

 Other:  Female gender previously seen. VC, 3VV and 3VTV visualized.
         Technically difficult due to maternal habitus and fetal position.
Cervix Uterus Adnexa

 Cervix
 Not visualized (advanced GA >65wks)
 Uterus
 No abnormality visualized.

 Right Ovary
 Within normal limits.

 Left Ovary
 Within normal limits.

 Cul De Sac
 No free fluid seen.

 Adnexa
 No abnormality visualized.
Comments

 This patient was seen for a follow up growth scan due to
 pregestational diabetes treated with insulin and history of
 cervical incompetence treated with a cerclage.  The patient
 denies any problems since her last exam.  She has a fetal
 echocardiogram scheduled with [HOSPITAL] pediatric cardiology
 next week.
 She was informed that the fetal growth and amniotic fluid
 level appears appropriate for her gestational age.
 Her cervix was difficult to visualize today as the fetal head
 was directly above the cervix.  Preterm labor precautions
 were reviewed with the patient.
 A follow up exam was scheduled in 4 weeks to assess the
 fetal growth.  We will start weekly fetal testing at 32 weeks.
